# Patient Record
Sex: Female | Born: 1975 | Race: White | Hispanic: No | Marital: Married | Smoking: Never smoker
Health system: Southern US, Community
[De-identification: ages and names within clinical notes are randomized; demographics above are authoritative.]

## PROBLEM LIST (undated history)

## (undated) DIAGNOSIS — J309 Allergic rhinitis, unspecified: Secondary | ICD-10-CM

## (undated) DIAGNOSIS — G44209 Tension-type headache, unspecified, not intractable: Secondary | ICD-10-CM

## (undated) HISTORY — DX: Allergic rhinitis, unspecified: J30.9

## (undated) HISTORY — DX: Tension-type headache, unspecified, not intractable: G44.209

---

## 1994-02-13 HISTORY — PX: BREAST SURGERY: SHX581

## 1997-07-03 ENCOUNTER — Other Ambulatory Visit: Admission: RE | Admit: 1997-07-03 | Discharge: 1997-07-03 | Payer: Self-pay | Admitting: Obstetrics and Gynecology

## 1998-08-24 ENCOUNTER — Other Ambulatory Visit: Admission: RE | Admit: 1998-08-24 | Discharge: 1998-08-24 | Payer: Self-pay | Admitting: Obstetrics and Gynecology

## 1999-08-23 ENCOUNTER — Other Ambulatory Visit: Admission: RE | Admit: 1999-08-23 | Discharge: 1999-08-23 | Payer: Self-pay | Admitting: Obstetrics and Gynecology

## 2002-03-21 ENCOUNTER — Other Ambulatory Visit: Admission: RE | Admit: 2002-03-21 | Discharge: 2002-03-21 | Payer: Self-pay | Admitting: Obstetrics and Gynecology

## 2002-09-05 ENCOUNTER — Encounter: Admission: RE | Admit: 2002-09-05 | Discharge: 2002-09-05 | Payer: Self-pay | Admitting: Obstetrics and Gynecology

## 2002-09-05 ENCOUNTER — Encounter: Payer: Self-pay | Admitting: Obstetrics and Gynecology

## 2003-05-07 ENCOUNTER — Other Ambulatory Visit: Admission: RE | Admit: 2003-05-07 | Discharge: 2003-05-07 | Payer: Self-pay | Admitting: Obstetrics and Gynecology

## 2004-06-08 ENCOUNTER — Other Ambulatory Visit: Admission: RE | Admit: 2004-06-08 | Discharge: 2004-06-08 | Payer: Self-pay | Admitting: Obstetrics and Gynecology

## 2004-08-17 ENCOUNTER — Other Ambulatory Visit: Admission: RE | Admit: 2004-08-17 | Discharge: 2004-08-17 | Payer: Self-pay | Admitting: Obstetrics and Gynecology

## 2005-02-14 ENCOUNTER — Other Ambulatory Visit: Admission: RE | Admit: 2005-02-14 | Discharge: 2005-02-14 | Payer: Self-pay | Admitting: Obstetrics and Gynecology

## 2005-05-11 ENCOUNTER — Encounter: Admission: RE | Admit: 2005-05-11 | Discharge: 2005-05-11 | Payer: Self-pay | Admitting: Family Medicine

## 2007-11-19 ENCOUNTER — Encounter (INDEPENDENT_AMBULATORY_CARE_PROVIDER_SITE_OTHER): Payer: Self-pay | Admitting: Obstetrics and Gynecology

## 2007-11-19 ENCOUNTER — Inpatient Hospital Stay (HOSPITAL_COMMUNITY): Admission: RE | Admit: 2007-11-19 | Discharge: 2007-11-22 | Payer: Self-pay | Admitting: Obstetrics and Gynecology

## 2008-02-14 DIAGNOSIS — G44209 Tension-type headache, unspecified, not intractable: Secondary | ICD-10-CM

## 2008-02-14 HISTORY — DX: Tension-type headache, unspecified, not intractable: G44.209

## 2010-06-28 NOTE — Op Note (Signed)
NAMEJENALYN, Briana Sanchez            ACCOUNT NO.:  [id number]   MEDICAL RECORD NO.:  [id number]          PATIENT TYPE:  INP   LOCATION:  9126                          FACILITY:  [facility]   PHYSICIAN:  Guy Sandifer. Henderson Cloud, M.D. DATE OF BIRTH:  23-Oct-1975   DATE OF PROCEDURE:  11/19/2007  DATE OF DISCHARGE:                               OPERATIVE REPORT   PREOPERATIVE DIAGNOSES:  1. Anterior pregnancy at 39-0/7 weeks.  2. Breech.   POSTOPERATIVE DIAGNOSES:  1. Anterior pregnancy at 39-0/7 weeks.  2. Breech.   PROCEDURE:  Low-transverse cesarean section.   SURGEON:  Guy Sandifer. Henderson Cloud, MD   ANESTHESIA:  Spinal, Quillian Quince, MD   ESTIMATED BLOOD LOSS:  800 mL.   SPECIMENS:  Placenta to pathology.   FINDINGS:  Viable female infant, Apgars of 6, 8 and 9 at 1, 5 and 10  minutes respectively.  Arterial pH 7.34.  Birth weight pending.   INDICATIONS AND CONSENT:  This patient is a 35 year old married white  female G1, P0 with an EDC of November 26, 2007, who is known to be  breech.  Details are dictated in history and physical.  Cesarean section  discussed preoperatively.  Potential risks and complications were  discussed preoperatively including but limited to infection, organ  damage, bleeding requiring transfusion of blood products with HIV and  hepatitis acquisition, DVT, PE, pneumonia.  All questions were answered  and consent was signed on the chart.   PROCEDURE:  The patient was taken to operating room where she was  identified, spinal anesthetic was placed, and she was placed in the  dorsal supine position with 15-degree left lateral wedge.  She was then  prepped.  Foley catheter was placed and bladders were drained and she  was draped in a sterile fashion.  After testing for adequate spinal  anesthesia, skin was entered through a Pfannenstiel incision.  Dissection was carried out in layers to the peritoneum.  Peritoneum was  incised and extended superiorly and inferiorly.   Vesicouterine  peritoneum was taken down cephalolaterally.  Bladder flap was developed  and bladder blade was placed.  Uterus was then incised in a low-  transverse manner and the uterine cavity was entered bluntly with a  hemostat.  Clear fluid was noted.  The uterine was incision was extended  cephalolaterally with fingers.  Baby was then delivered from the frank  breech position without difficulty.  A single nuchal cord was noted and  reduced.  Oro and nasopharynx were suctioned.  Cord was clamped and cut.  Good cry and tone was noted and the baby was handed to waiting  pediatrics team.  Placenta was then manually delivered and sent to  pathology.  Inspection revealed uterine cavity to be clean.  Uterus was  closed in two running locking imbricating layers of 0 Monocryl suture.  A single figure-of-eight of the same suture was placed in the center of  the incision to obtain complete hemostasis.  Anterior peritoneum was  closed in  running fashion with 0 Monocryl suture, which was also used to  reapproximate the foraminal muscle in midline.  Anterior rectus fascia  was closed in running fashion with a 0 loop PDS.  Skin was closed with  clips.  All sponge, instrument, and needle counts were correct and the  patient was transferred to the recovery room in stable condition.      Guy Sandifer Henderson Cloud, M.D.  Electronically Signed     JET/MEDQ  D:  11/19/2007  T:  11/20/2007  Job:  [id number]

## 2010-06-28 NOTE — H&P (Signed)
Briana Sanchez, CARE            ACCOUNT NO.:  [id number]   MEDICAL RECORD NO.:  [id number]          PATIENT TYPE:  INP   LOCATION:                                FACILITY:  [facility]   PHYSICIAN:  Guy Sandifer. Henderson Cloud, M.D. DATE OF BIRTH:  03-06-1975   DATE OF ADMISSION:  11/19/2007  DATE OF DISCHARGE:                              HISTORY & PHYSICAL   CHIEF COMPLAINT:  Breech   HISTORY OF PRESENT ILLNESS:  This patient is a 35 year old married white  female G1, P0 with an EDC of November 26, 2007, placing her at 47 and  0/7th weeks estimated additional age who has a breech baby on  examination and ultrasound.  After discussing options, she is being  admitted for cesarean section.  Potential risks and complications have  been discussed preoperatively.   PAST MEDICAL HISTORY, PAST SURGICAL HISTORY, FAMILY HISTORY, OBSTETRIC  HISTORY:  See prenatal history and physical.   REVIEW OF SYMPTOMS:  NEURO:  Denies headache.  CARDIAC:  Denies chest  pain.  PULMONARY:  Denies shortness of breath.   MEDICATIONS:  Prenatal vitamins.   ALLERGIES:  No known drug allergies.   PHYSICAL EXAMINATION:  VITAL SIGNS:  Height 5 feet 1 inch, weight 143  pounds, blood pressure 100/62.  LUNGS:  Clear to auscultation.  HEART:  Regular rate and rhythm.  ABDOMEN:  Gravid, nontender.  Cervix is closed, thick, high.  EXTREMITIES:  Grossly within normal limits.  NEUROLOGIC:  Grossly within normal limits.   ASSESSMENT:  Intrauterine pregnancy at 39 weeks with breech.   PLAN:  Cesarean section.      Guy Sandifer Henderson Cloud, M.D.  Electronically Signed     JET/MEDQ  D:  11/18/2007  T:  11/19/2007  Job:  [id number]

## 2010-07-01 NOTE — Discharge Summary (Signed)
Briana Sanchez, Briana Sanchez            ACCOUNT NO.:  [id number]   MEDICAL RECORD NO.:  [id number]          PATIENT TYPE:  INP   LOCATION:  9126                          FACILITY:  [facility]   PHYSICIAN:  Juluis Mire, M.D.   DATE OF BIRTH:  1976/02/05   DATE OF ADMISSION:  11/19/2007  DATE OF DISCHARGE:  11/22/2007                               DISCHARGE SUMMARY   ADMITTING DIAGNOSES:  1. Intrauterine pregnancy at 75 weeks estimated gestational age.  2. Breech presentation.   DISCHARGE DIAGNOSES:  1. Status post low transverse cesarean section.  2. Viable female infant.   PROCEDURE:  Primary low transverse cesarean section.   REASON FOR ADMISSION:  Please see dictated H&P.   HOSPITAL COURSE:  The patient is a 35 year old white married female  primigravida that was admitted to [facility] for  scheduled cesarean section.  The patient did have a known breech  presentation.  On the morning of admission, the patient was taken to the  operating room where spinal anesthesia was administered without  difficulty.  A low transverse incision was made, delivery of a viable  female infant weighing 7 pounds 10 ounces, Apgars of 6 at 1 minute, 8 at  5 minute, 9 at 10 minute.  Arterial cord pH was 7.34.  The patient  tolerated the procedure well, was taken to the recovery room in stable  condition.  On postoperative day #1, the patient was without complaint.  Vital signs were stable.  She was afebrile.  Abdomen soft with good  return of bowel function.  Fundus was firm and nontender.  Abdominal  dressing was noted to be clean, dry, and intact.  Foley had been  discontinued; however, the patient has not voided at the time of  rounding.  Laboratory findings revealed hemoglobin of 11.8, platelet  count of 169,000, WBC count of 11.7.  Blood type was noted to be A  positive.  Postoperative day #2, the patient was without complaint other  than some itching at the incisional site.  Vital  signs were stable.  She  was afebrile.  Abdomen soft.  Fundus firm and nontender.  Staples were  intact.  Small erythema was noted from previous adherence.  The patient  was given some hydrocortisone cream topically to apply to the affected  area.  On postoperative day #3, the patient was without complaint.  Vital signs were stable.  She was afebrile.  Fundus firm and nontender.  Incision was clean, dry, and intact.  Staples were removed and the  patient was later discharged home.   CONDITION ON DISCHARGE:  Stable.   DIET:  Regular as tolerated.   ACTIVITY:  No heavy lifting, no driving x2 weeks, no vaginal entry.   FOLLOW UP:  The patient to follow up in the office in 1-2 weeks for  incision check.  She is to call for temperature greater than 100  degrees, persistent nausea, vomiting, heavy vaginal bleeding and/or  redness or drainage from the incisional site.   DISCHARGE MEDICATIONS:  1. Tylox #30 one p.o. every 4-6 hours p.r.n.  2. Motrin 600 mg every  6 hours.  3. Prenatal vitamins one p.o. daily.  4. Colace one p.o. daily.      Julio Sicks, N.P.      Juluis Mire, M.D.  Electronically Signed    CC/MEDQ  D:  12/23/2007  T:  12/23/2007  Job:  [id number]

## 2010-09-30 ENCOUNTER — Ambulatory Visit: Payer: Self-pay | Admitting: Internal Medicine

## 2010-11-02 ENCOUNTER — Ambulatory Visit (INDEPENDENT_AMBULATORY_CARE_PROVIDER_SITE_OTHER): Payer: 59 | Admitting: Internal Medicine

## 2010-11-02 ENCOUNTER — Encounter: Payer: Self-pay | Admitting: Internal Medicine

## 2010-11-02 VITALS — BP 104/64 | HR 60 | Temp 97.6°F | Resp 16 | Ht 61.0 in | Wt 115.0 lb

## 2010-11-02 DIAGNOSIS — Z0000 Encounter for general adult medical examination without abnormal findings: Secondary | ICD-10-CM

## 2010-11-02 NOTE — Progress Notes (Signed)
  Subjective:    Patient ID: Briana Sanchez, female    DOB: Jul 07, 1975, 35 y.o.   MRN: [id number]  HPI New to me for a brief physical, she saw Dr. Huntley Dec 9 months ago and she tells me that her labs (FLP,TSH,CBC) were normal but no records are available for me today.   Review of Systems  Constitutional: Negative.   HENT: Negative.   Eyes: Negative.   Respiratory: Negative.   Cardiovascular: Negative.   Gastrointestinal: Negative.   Genitourinary: Negative.   Musculoskeletal: Negative.   Skin: Negative.   Neurological: Negative.   Hematological: Negative.   Psychiatric/Behavioral: Negative.        Objective:   Physical Exam  Vitals reviewed. Constitutional: She is oriented to person, place, and time. She appears well-developed and well-nourished. No distress.  HENT:  Mouth/Throat: Oropharynx is clear and moist. No oropharyngeal exudate.  Eyes: Conjunctivae are normal. Right eye exhibits no discharge. Left eye exhibits no discharge. No scleral icterus.  Neck: Normal range of motion. Neck supple. No JVD present. No tracheal deviation present. No thyromegaly present.  Cardiovascular: Normal rate, regular rhythm, normal heart sounds and intact distal pulses.  Exam reveals no gallop and no friction rub.   No murmur heard. Pulmonary/Chest: Effort normal and breath sounds normal. No stridor. No respiratory distress. She has no wheezes. She has no rales. She exhibits no tenderness.  Abdominal: Soft. Bowel sounds are normal. She exhibits no distension and no mass. There is no tenderness. There is no rebound and no guarding.  Musculoskeletal: Normal range of motion. She exhibits no edema and no tenderness.  Lymphadenopathy:    She has no cervical adenopathy.  Neurological: She is alert and oriented to person, place, and time. She has normal reflexes.  Skin: Skin is warm and dry. No rash noted. She is not diaphoretic. No erythema. No pallor.  Psychiatric: She has a normal mood and  affect. Her behavior is normal. Judgment and thought content normal.          Assessment & Plan:

## 2010-11-02 NOTE — Patient Instructions (Signed)

## 2010-11-15 LAB — CBC
HCT: 40.5
MCHC: 33.5
MCV: 93.2
MCV: 93.5
Platelets: 169
Platelets: 194
RBC: 3.65 — ABNORMAL LOW
RDW: 12.9
WBC: 11.7 — ABNORMAL HIGH
WBC: 12.4 — ABNORMAL HIGH

## 2011-03-22 ENCOUNTER — Ambulatory Visit (INDEPENDENT_AMBULATORY_CARE_PROVIDER_SITE_OTHER)
Admission: RE | Admit: 2011-03-22 | Discharge: 2011-03-22 | Disposition: A | Discharge status: Home / Self Care | Payer: 59 | Source: Ambulatory Visit | Attending: Internal Medicine | Admitting: Internal Medicine

## 2011-03-22 ENCOUNTER — Encounter: Payer: Self-pay | Admitting: Internal Medicine

## 2011-03-22 ENCOUNTER — Ambulatory Visit (INDEPENDENT_AMBULATORY_CARE_PROVIDER_SITE_OTHER): Payer: 59 | Admitting: Internal Medicine

## 2011-03-22 VITALS — BP 98/68 | HR 74 | Temp 98.0°F | Resp 16 | Ht 61.0 in | Wt 116.0 lb

## 2011-03-22 DIAGNOSIS — J209 Acute bronchitis, unspecified: Secondary | ICD-10-CM | POA: Insufficient documentation

## 2011-03-22 DIAGNOSIS — J189 Pneumonia, unspecified organism: Secondary | ICD-10-CM | POA: Insufficient documentation

## 2011-03-22 DIAGNOSIS — R05 Cough: Secondary | ICD-10-CM

## 2011-03-22 MED ORDER — CEFUROXIME AXETIL 500 MG PO TABS: 500.0000 mg | ORAL_TABLET | Freq: Two times a day (BID) | ORAL | Status: AC

## 2011-03-22 MED ORDER — PSEUDOEPHEDRINE-CODEINE-GG 30-10-100 MG/5ML PO SOLN: 10.0000 mL | Freq: Four times a day (QID) | ORAL | Status: AC | PRN

## 2011-03-22 MED ORDER — AZITHROMYCIN 500 MG PO TABS: 500.0000 mg | ORAL_TABLET | Freq: Every day | ORAL | Status: AC

## 2011-03-22 NOTE — Patient Instructions (Signed)

## 2011-03-23 ENCOUNTER — Telehealth: Payer: Self-pay

## 2011-03-23 NOTE — Assessment & Plan Note (Signed)
CXR is + for PNA 

## 2011-03-23 NOTE — Assessment & Plan Note (Signed)
I have asked her to take double antibiotic coverage with zpak and ceftin

## 2011-03-23 NOTE — Progress Notes (Signed)
  Subjective:    Patient ID: Briana Sanchez, female    DOB: 08/29/1975, 36 y.o.   MRN: [id number]  URI  This is a new problem. The current episode started in the past 7 days. The problem has been gradually worsening. The maximum temperature recorded prior to her arrival was 102 - 102.9 F. The fever has been present for 1 to 2 days. Associated symptoms include congestion, coughing and a sore throat. Pertinent negatives include no abdominal pain, chest pain, diarrhea, dysuria, ear pain, headaches, joint pain, joint swelling, nausea, neck pain, plugged ear sensation, rash, rhinorrhea, sinus pain, sneezing, swollen glands, vomiting or wheezing. She has tried nothing for the symptoms.      Review of Systems  Constitutional: Positive for fever, chills and fatigue. Negative for diaphoresis, activity change, appetite change and unexpected weight change.  HENT: Positive for congestion and sore throat. Negative for ear pain, facial swelling, rhinorrhea, sneezing, trouble swallowing, neck pain, neck stiffness, voice change, postnasal drip, sinus pressure and ear discharge.   Eyes: Negative.   Respiratory: Positive for cough. Negative for apnea, choking, chest tightness, shortness of breath, wheezing and stridor.   Cardiovascular: Negative for chest pain, palpitations and leg swelling.  Gastrointestinal: Negative for nausea, vomiting, abdominal pain, diarrhea, constipation, blood in stool, abdominal distention, anal bleeding and rectal pain.  Genitourinary: Negative for dysuria, urgency, frequency, hematuria, flank pain, decreased urine volume, vaginal bleeding, vaginal discharge, enuresis, difficulty urinating, genital sores, vaginal pain, menstrual problem, pelvic pain and dyspareunia.  Musculoskeletal: Negative for myalgias, back pain, joint pain, joint swelling, arthralgias and gait problem.  Skin: Negative for color change, pallor, rash and wound.  Neurological: Negative for dizziness, tremors,  seizures, syncope, facial asymmetry, speech difficulty, weakness, light-headedness, numbness and headaches.  Hematological: Negative for adenopathy. Does not bruise/bleed easily.  Psychiatric/Behavioral: Negative.        Objective:   Physical Exam  Vitals reviewed. Constitutional: She is oriented to person, place, and time. She appears well-developed and well-nourished. No distress.  HENT:  Head: Normocephalic and atraumatic.  Mouth/Throat: Oropharynx is clear and moist. No oropharyngeal exudate.  Eyes: Conjunctivae are normal. Right eye exhibits no discharge. Left eye exhibits no discharge. No scleral icterus.  Neck: Normal range of motion. Neck supple. No JVD present. No tracheal deviation present. No thyromegaly present.  Cardiovascular: Normal rate, regular rhythm, normal heart sounds and intact distal pulses.  Exam reveals no gallop and no friction rub.   No murmur heard. Pulmonary/Chest: Effort normal and breath sounds normal. No stridor. No respiratory distress. She has no wheezes. She has no rales. She exhibits no tenderness.  Abdominal: Soft. Bowel sounds are normal. She exhibits no distension and no mass. There is no tenderness. There is no rebound and no guarding.  Musculoskeletal: Normal range of motion. She exhibits no edema and no tenderness.  Lymphadenopathy:    She has no cervical adenopathy.  Neurological: She is oriented to person, place, and time.  Skin: Skin is warm and dry. No rash noted. She is not diaphoretic. No erythema. No pallor.  Psychiatric: She has a normal mood and affect. Her behavior is normal. Judgment and thought content normal.      Lab Results  Component Value Date   WBC 11.7* 11/20/2007   HGB 11.8* 11/20/2007   HCT 34.1* 11/20/2007   PLT 169 11/20/2007      Assessment & Plan:

## 2011-03-23 NOTE — Assessment & Plan Note (Signed)
Start antibiotics and symptom reliever

## 2011-03-23 NOTE — Telephone Encounter (Signed)
Patient called [facility] requesting to know if she is considered contagious due to recent dx of pneumonia. Check with MD who advised that pt contagious until she has had abx in  System for at least 48hrs. Patient notified

## 2011-03-29 ENCOUNTER — Telehealth: Payer: Self-pay

## 2011-03-29 DIAGNOSIS — J189 Pneumonia, unspecified organism: Secondary | ICD-10-CM

## 2011-03-29 NOTE — Telephone Encounter (Signed)
sure

## 2011-03-29 NOTE — Telephone Encounter (Signed)
Called patient, no answer//order placed in Epic

## 2011-03-29 NOTE — Telephone Encounter (Signed)
Patient called stating that she was seen two weeks ago and dx with pneumona. She recevied cxr result letter which advised her to repeat in two weeks. Please advise if ok to order Thanks

## 2011-03-30 NOTE — Telephone Encounter (Signed)
Called patient/[facility] advising of xray order

## 2011-04-06 ENCOUNTER — Ambulatory Visit (INDEPENDENT_AMBULATORY_CARE_PROVIDER_SITE_OTHER)
Admission: RE | Admit: 2011-04-06 | Discharge: 2011-04-06 | Disposition: A | Discharge status: Home / Self Care | Payer: 59 | Source: Ambulatory Visit | Attending: Internal Medicine | Admitting: Internal Medicine

## 2011-04-06 DIAGNOSIS — J189 Pneumonia, unspecified organism: Secondary | ICD-10-CM

## 2011-06-26 ENCOUNTER — Encounter: Payer: Self-pay | Admitting: Internal Medicine

## 2011-06-26 ENCOUNTER — Ambulatory Visit (INDEPENDENT_AMBULATORY_CARE_PROVIDER_SITE_OTHER): Payer: 59 | Admitting: Internal Medicine

## 2011-06-26 VITALS — BP 90/68 | HR 70 | Temp 98.1°F | Ht 62.0 in | Wt 117.1 lb

## 2011-06-26 DIAGNOSIS — J309 Allergic rhinitis, unspecified: Secondary | ICD-10-CM

## 2011-06-26 DIAGNOSIS — H1010 Acute atopic conjunctivitis, unspecified eye: Secondary | ICD-10-CM

## 2011-06-26 DIAGNOSIS — H1045 Other chronic allergic conjunctivitis: Secondary | ICD-10-CM

## 2011-06-26 MED ORDER — AZELASTINE-FLUTICASONE 137-50 MCG/ACT NA SUSP: 1.0000 | Freq: Two times a day (BID) | NASAL | Status: DC

## 2011-06-26 MED ORDER — PREDNISONE (PAK) 10 MG PO TABS: 10.0000 mg | ORAL_TABLET | ORAL | Status: AC

## 2011-06-26 MED ORDER — FEXOFENADINE HCL 180 MG PO TABS: 180.0000 mg | ORAL_TABLET | Freq: Every day | ORAL | Status: AC

## 2011-06-26 NOTE — Progress Notes (Signed)
complains of severe allergic rhinitis symptoms: Clear drainage fro nose and eyes, sneezing Using eye drops from optho but unimproved OTC meds ineffective  Past Medical History  Diagnosis Date  . Tension headache 2010     ROS: Gen: no fever HENT: no sinus pressure Resp: No cough   PE:  BP 90/68  Pulse 70  Temp(Src) 98.1 F (36.7 C) (Oral)  Ht 5\' 2"  (1.575 m)  Wt 117 lb 1.9 oz (53.125 kg)  BMI 21.42 kg/m2  SpO2 96% Gen: soft tissue edema/swelling nasal region and inner eyes - nontoxic HENT: clear rhinorrhea, no sinus tenderness to palp, OP clear Eyes: mod conjunctivitis B, PERRL, vision intact Resp: CTA B CV: RRR   A/P: Allergic sinusitis, severe symptoms Allergic conjunctivitis  No evidence for infection Continue Allegra Add nasal antihistamine and steroid combo pred pak for severe symptoms  Continue optho tx as ongoing Call if worse or unimproved - pt understands and agrees

## 2011-06-26 NOTE — Patient Instructions (Signed)
It was good to see you today. If you develop worsening symptoms or fever, call and we can reconsider antibiotics, but it does not appear necessary to use antibiotics at this time. Start Dymista - 1 spray each nostril 2x/day Also pred taper for severe allergies over next 6 days Your prescription(s) have been submitted to your pharmacy. Please take as directed and contact our office if you believe you are having problem(s) with the medication(s). Continue Allegra or Zyrtec daily

## 2011-07-28 ENCOUNTER — Encounter: Payer: Self-pay | Admitting: Internal Medicine

## 2011-07-28 ENCOUNTER — Ambulatory Visit (INDEPENDENT_AMBULATORY_CARE_PROVIDER_SITE_OTHER): Payer: 59 | Admitting: Internal Medicine

## 2011-07-28 VITALS — BP 120/68 | HR 73 | Temp 97.6°F | Resp 16 | Wt 120.0 lb

## 2011-07-28 DIAGNOSIS — H04559 Acquired stenosis of unspecified nasolacrimal duct: Secondary | ICD-10-CM

## 2011-07-28 DIAGNOSIS — IMO0002 Reserved for concepts with insufficient information to code with codable children: Secondary | ICD-10-CM | POA: Insufficient documentation

## 2011-07-28 NOTE — Patient Instructions (Signed)
Dacryocystitis  Dacryocystitis is an infection of the tear sac. The tear sac lies between the inner corner of the eyelids and the nose. The glands of the eyelids always produce tears. This is to keep the surface of the eye wet and protect it. These tears drain from the surface of the eyes through a duct in each lid (lacrimal ducts), then through the tear sac (lacrimal sac) into the nose. The tears are then swallowed. If the tear ducts become blocked, bacteria begin to buildup. The tear sac can become infected. Dacryocystitis may be sudden (acute) or long-lasting (chronic). This problem is most common in infants because the tear ducts are not fully developed and clog easily. In that case, babies may have episodes of tearing and infection. However, in most cases, the problem gets better as the child grows. CAUSES   Malformation of the tear duct.   Injury.   Eye infection.   Trauma.   Injury or inflammation of the nasal passages.  The cause is often unknown. SYMPTOMS   Usually only 1 eye is involved.   Tearing and discharge from the involved eye.   Tenderness, redness and swelling of the lower lid near the nose.   A sore, red, inflamed bump on the inner corner of the lower lid.  DIAGNOSIS  A diagnosis is made by:  An exam of the eye to find out how much blockage is present and if the surface of the eye is also infected.   Getting cultures to see if a specific infection is present.  TREATMENT  Treatment depends on:   The person's age.   Whether or not the infection is chronic or acute.   Amount of blockage is present.  Additional treatment  In infants, sometimes massaging the area (starting from the inside of the eye and gently massaging down toward the nose) will improve the condition, combined with antibiotic eye drops or ointments.   If the above does not work, it may be necessary to probe the ducts and open up the drainage system. While this is easily done in the office in  adults, probing usually has to be done under general anesthesia in infants.   If the blockage cannot be cleared by probing, surgery may be needed under general anesthesia to create a direct opening for tears to flow between the tear sac and the inside of the nose (dacryocystorhinostomy or DCR).  HOME CARE INSTRUCTIONS   Antibiotic drops or ointment may be prescribed after probing procedures.   If surgery has been performed, oral antibiotics may also be prescribed.   Finish all medications as prescribed, even if your symptoms get better.  SEEK MEDICAL CARE IF:  You develop tearing of 1 or both eyes and a sore, red, swollen lump between your lower eyelid and nose. This is especially true if you have these symptoms and a fever. SEEK IMMEDIATE MEDICAL CARE IF:   There is increased pain, swelling, redness or drainage from the eye.   There are signs of generalized infection including muscle aches, chills, fever or a general ill feeling.   You or your child has an oral temperature above 102 F (38.9 C), not controlled by medicine.   Your baby is older than 3 months with a rectal temperature of 102 F (38.9 C) or higher.   Your baby is 3 months old or younger with a rectal temperature of 100.4 F (38 C) or higher.  MAKE SURE YOU:   Understand these instructions.   Will   watch your condition.   Will get help right away if you are not doing well or get worse.  Document Released: 01/28/2000 Document Revised: 01/19/2011 Document Reviewed: 03/21/2007 [employer] Patient Information 2012 [employer]. 

## 2011-07-29 ENCOUNTER — Encounter: Payer: Self-pay | Admitting: Internal Medicine

## 2011-07-29 NOTE — Progress Notes (Signed)
  Subjective:    Patient ID: Briana Sanchez, female    DOB: Aug 27, 1975, 36 y.o.   MRN: [id number]  Eye Problem  There is pain in the right eye. This is a recurrent problem. The current episode started more than 1 month ago. The problem occurs constantly. The problem has been gradually worsening. There was no injury mechanism. The pain is at a severity of 0/10. The patient is experiencing no pain. There is no known exposure to pink eye. She does not wear contacts. Associated symptoms include an eye discharge (constant clear drainage (watering) from her right eye,). Pertinent negatives include no blurred vision, double vision, eye redness, fever, foreign body sensation, itching, nausea, photophobia, recent URI or vomiting. She has tried eye drops for the symptoms. The treatment provided no relief.      Review of Systems  Constitutional: Negative.  Negative for fever.  HENT: Negative.   Eyes: Positive for discharge (constant clear drainage (watering) from her right eye,). Negative for blurred vision, double vision, photophobia, pain, redness, itching and visual disturbance.  Respiratory: Negative.   Cardiovascular: Negative.   Gastrointestinal: Negative.  Negative for nausea and vomiting.  Genitourinary: Negative.   Musculoskeletal: Negative.   Skin: Negative.  Negative for itching.  Neurological: Negative.   Hematological: Negative.   Psychiatric/Behavioral: Negative.        Objective:   Physical Exam  Vitals reviewed. Constitutional: She appears well-developed and well-nourished. No distress.  Eyes: Conjunctivae and EOM are normal. Right eye exhibits no chemosis, no discharge, no exudate and no hordeolum. No foreign body present in the right eye. Left eye exhibits no chemosis and no hordeolum. No foreign body present in the left eye. Right conjunctiva is not injected. Right conjunctiva has no hemorrhage. Left conjunctiva is not injected. Left conjunctiva has no hemorrhage. No scleral  icterus. Right eye exhibits normal extraocular motion and no nystagmus. Left eye exhibits normal extraocular motion and no nystagmus.    Neck: Normal range of motion. Neck supple. No JVD present. No tracheal deviation present. No thyromegaly present.  Cardiovascular: Normal rate, regular rhythm, normal heart sounds and intact distal pulses.  Exam reveals no gallop and no friction rub.   No murmur heard. Pulmonary/Chest: Effort normal and breath sounds normal. No stridor. No respiratory distress. She has no wheezes. She has no rales. She exhibits no tenderness.  Lymphadenopathy:    She has no cervical adenopathy.  Skin: Skin is warm and dry. No rash noted. She is not diaphoretic. No erythema. No pallor.          Assessment & Plan:

## 2011-07-29 NOTE — Assessment & Plan Note (Signed)
I see no s/s of infection or allergy today, I have asked her to see ophthalmology to see about the tear duct

## 2011-12-29 ENCOUNTER — Ambulatory Visit (INDEPENDENT_AMBULATORY_CARE_PROVIDER_SITE_OTHER): Payer: 59 | Admitting: Internal Medicine

## 2011-12-29 ENCOUNTER — Encounter: Payer: Self-pay | Admitting: Internal Medicine

## 2011-12-29 ENCOUNTER — Other Ambulatory Visit: Payer: 59

## 2011-12-29 VITALS — BP 108/78 | HR 70 | Ht 61.5 in | Wt 120.8 lb

## 2011-12-29 DIAGNOSIS — J309 Allergic rhinitis, unspecified: Secondary | ICD-10-CM

## 2011-12-29 DIAGNOSIS — H1010 Acute atopic conjunctivitis, unspecified eye: Secondary | ICD-10-CM

## 2011-12-29 DIAGNOSIS — H1045 Other chronic allergic conjunctivitis: Secondary | ICD-10-CM

## 2011-12-29 MED ORDER — AZELASTINE-FLUTICASONE 137-50 MCG/ACT NA SUSP: 2.0000 | Freq: Every day | NASAL | Status: DC

## 2011-12-29 NOTE — Patient Instructions (Addendum)
Sample Dymista nasal spray      1-2 puffs each nostril once daily at bedtime   Effect tends to be cumulative, building up over several days of use  Order- Allergy profile     Dx allergic rhinitis, allergic conjunctivitis  Ok to continue Allegra and Dr Randon Goldsmith' eye drops  Please call as needed

## 2011-12-29 NOTE — Progress Notes (Signed)
12/29/11 36 yoF never smoker seeking allergy evaluation at the suggestion of her ophthalmologist Dr. Antony Contras She was diagnosed with allergic rhinitis in childhood and has had seasonal problems since then. Usually this is felt in spring and fall as burning and itching of eyes, sneezing. She has not had asthma or significant problems with food or skin rashes. Skin testing as a child without allergy vaccine. No ENT surgery. She is being treated for obstructed lacrimal ducts and allergic conjunctivitis using eyedrops, rarely including a steroid eye drop. Primary recognized trigger has been seasonal pollens. Last prednisone April 2013. Married living with husband and 46-year-old daughter, working in an office environment. House has basement, no recognized mold, no pets or smokers. She does not take flu shot. Told that she snores when nose is congested. Some associated sore throat and headaches. CXR 04/06/11-reviewed IMPRESSION:  No active disease. Improvement in aeration. No new infiltrate.  Original Report Authenticated By: Natasha Mead, M.D.    Prior to Admission medications   Medication Sig Start Date End Date Taking? Authorizing Provider  Azelastine-Fluticasone 137-50 MCG/ACT SUSP Place 1 spray into the nose 2 (two) times daily. 06/26/11  Yes Newt Lukes, MD  fexofenadine (ALLEGRA) 180 MG tablet Take 1 tablet (180 mg total) by mouth daily. 06/26/11 06/25/12 Yes Newt Lukes, MD  Multiple Vitamin (MULTIVITAMIN) tablet Take 1 tablet by mouth daily.   Yes Historical Provider, MD  Norgestimate-Eth Estradiol (ORTHO-CYCLEN, 28, PO) Take by mouth.     Yes Historical Provider, MD  olopatadine (PATANOL) 0.1 % ophthalmic solution Place 1 drop into both eyes 2 (two) times daily. 06/26/11  Yes Newt Lukes, MD  Azelastine-Fluticasone (DYMISTA) 137-50 MCG/ACT SUSP Place 2 sprays into both nostrils at bedtime. 12/29/11   Waymon Budge, MD   Past Medical History  Diagnosis Date  . Tension  headache 2010  . Allergic rhinitis    Past Surgical History  Procedure Date  . Cesarean section 11/19/2007  . Breast surgery 1996    augmentation   Family History  Problem Relation Age of Onset  . Alcohol abuse Other   . Drug abuse Other   . Arthritis Other   . Lung cancer Paternal Grandfather   . Hypertension Other   . Emphysema Maternal Grandmother   . Rheum arthritis Maternal Grandmother   . Rheum arthritis Mother   . Cervical cancer Maternal Grandmother    History   Social History  . Marital Status: Married    Spouse Name: N/A    Number of Children: 1  . Years of Education: N/A   Occupational History  . brand Production designer, theatre/television/film   .  [employer]   Social History Main Topics  . Smoking status: Never Smoker   . Smokeless tobacco: Never Used  . Alcohol Use: 1.2 oz/week    2 Glasses of wine per week  . Drug Use: No  . Sexually Active: Yes    Birth Control/ Protection: Pill   Other Topics Concern  . Not on file   Social History Narrative   Caffienated drinks-RareSeat belt use often-yesRegular Exercise-yesSmoke alarm in the home-yesFirearms/guns in the home-noHistory of physical abuse-no   ROS-see HPI Constitutional:   No-   weight loss, night sweats, fevers, chills, fatigue, lassitude. HEENT:   No-  headaches, difficulty swallowing, tooth/dental problems, sore throat,       +  sneezing, itching, ear ache, nasal congestion, post nasal drip,  CV:  No-   chest pain, orthopnea, PND, swelling in lower  extremities, anasarca, dizziness, palpitations Resp: No-   shortness of breath with exertion or at rest.              No-   productive cough,  No non-productive cough,  No- coughing up of blood.              No-   change in color of mucus.  No- wheezing.   Skin: No-   rash or lesions. GI:  No-   heartburn, indigestion, abdominal pain, nausea, vomiting,  GU:  MS:  No-   joint pain or swelling.   Neuro-     nothing unusual Psych:  No- change in mood or affect. No  depression or anxiety.  No memory loss.   OBJ- Physical Exam General- Alert, Oriented, Affect-appropriate, Distress- none acute Skin- rash-none, lesions- none, excoriation- none Lymphadenopathy- none Head- atraumatic            Eyes- Gross vision intact, PERRLA, minimal conjunctival injection            Ears- Hearing, canals-normal            Nose- + watery sniffing, no-Septal dev, mucus, polyps, erosion, perforation             Throat- Mallampati III , mucosa+ red posterior pharynx without cobblestone , drainage- none, tonsils- atrophic Neck- flexible , trachea midline, no stridor , thyroid nl, carotid no bruit Chest - symmetrical excursion , unlabored           Heart/CV- RRR , no murmur , no gallop  , no rub, nl s1 s2                           - JVD- none , edema- none, stasis changes- none, varices- none           Lung- clear to P&A, wheeze- none, cough- none , dullness-none, rub- none           Chest wall-  Abd- tender-no, distended-no, bowel sounds-present, HSM- no Br/ Gen/ Rectal- Not done, not indicated Extrem- cyanosis- none, clubbing, none, atrophy- none, strength- nl Neuro- grossly intact to observation

## 2012-01-01 LAB — ALLERGY FULL PROFILE
Allergen, D pternoyssinus,d7: 1.46 kU/L — ABNORMAL HIGH
Allergen,Goose feathers, e70: <0.1 kU/L
Alternaria Alternata: <0.1 kU/L
Aspergillus fumigatus, IgG: <0.1 kU/L
Bahia Grass: 4.09 kU/L — ABNORMAL HIGH
Box Elder IgE: <0.1 kU/L
Cat Dander: <0.1 kU/L
Common Ragweed: <0.1 kU/L
Dog Dander: 0.19 kU/L — ABNORMAL HIGH
G005 Rye, Perennial: 22.1 kU/L — ABNORMAL HIGH
Goldenrod: <0.1 kU/L
Helminthosporium halodes: <0.1 kU/L
House Dust Hollister: 0.82 kU/L — ABNORMAL HIGH
IgE (Immunoglobulin E), Serum: 250.8 IU/mL — ABNORMAL HIGH (ref 0.0–180.0)
Lamb's Quarters: 0.16 kU/L — ABNORMAL HIGH
Plantain: 0.15 kU/L — ABNORMAL HIGH
Stemphylium Botryosum: <0.1 kU/L
Sycamore Tree: 0.48 kU/L — ABNORMAL HIGH

## 2012-01-04 NOTE — Progress Notes (Signed)
Quick Note:  Pt aware of results and future appt. ______

## 2012-01-07 DIAGNOSIS — J309 Allergic rhinitis, unspecified: Secondary | ICD-10-CM | POA: Insufficient documentation

## 2012-01-07 NOTE — Assessment & Plan Note (Signed)
Plan-environmental precautions reviewed. Lab-Allergy profile. Sample Dymista nasal spray (antihistamine plus steroid)

## 2012-01-30 ENCOUNTER — Telehealth: Payer: Self-pay | Admitting: Internal Medicine

## 2012-01-30 DIAGNOSIS — H1010 Acute atopic conjunctivitis, unspecified eye: Secondary | ICD-10-CM

## 2012-01-30 MED ORDER — METHYLPREDNISOLONE 4 MG PO KIT: PACK | ORAL | Status: DC

## 2012-01-30 NOTE — Telephone Encounter (Signed)
Patient returning call.

## 2012-01-30 NOTE — Telephone Encounter (Signed)
Pt returned call. Briana Sanchez  

## 2012-01-30 NOTE — Telephone Encounter (Signed)
Patient Information:  Caller Name: Bryonna  Phone: [phone]  Patient: Briana Sanchez, Briana Sanchez  Gender: Female  DOB: Nov 10, 1975  Age: 36 Years  PCP: Sanda Linger (Adults only)  Pregnant: No  Office Follow Up:  Does the office need to follow up with this patient?: Yes  Instructions For The Office: Please call, patient wants to know if Prednisone can be called in for her.  RN Note:  Triaged per allergy symptoms in [facility] which disposition was see provider within 24hrs. Patient is requesting medication. States before when this has happened she was given Prednisone to help with symptoms. Pharmacy is [facility] [address]  Symptoms  Reason For Call & Symptoms: Increase in allergy symptoms. Itchy , red, watery eyes., sneezing and itchy nose  Reviewed Health History In EMR: Yes  Reviewed Medications In EMR: Yes  Reviewed Allergies In EMR: Yes  Reviewed Surgeries / Procedures: Yes  Date of Onset of Symptoms: 01/28/2012  Treatments Tried: Allegra, prescribed nasal spray and eye drops. She doubled allegra dose to 2 pills daily, states physician told her this was alright to do when she needed to.  Treatments Tried Worked: No OB:  LMP: 01/24/2012  Guideline(s) Used:  Eye - Red Without Pus  Disposition Per Guideline:   Home Care  Reason For Disposition Reached:   Red eye caused by sunscreen, smoke, smog, chlorine, food, soap or other mild irritant  Advice Given:  Call Back If:  You become worse.  Patient Refused Recommendation:  Patient Requests Prescription  Patient wants to know if Prednisone can be called in for her.

## 2012-01-30 NOTE — Telephone Encounter (Signed)
LMTC x 1  

## 2012-01-30 NOTE — Telephone Encounter (Signed)
Left message for pt to callback office.  

## 2012-01-30 NOTE — Telephone Encounter (Signed)
lmomtcb x1 for pt 

## 2012-01-30 NOTE — Telephone Encounter (Signed)
done

## 2012-01-30 NOTE — Telephone Encounter (Signed)
Pt c/o red, itchy eyes, sneezing, runny nose for 2-3 days..  Pt is taking Allegra 180 mg daily, Dymista at bedtime and Patanol prn.  Please advise on what else pt can do.    Last ov:12-29-11    Next ov:02-16-12 No Known Allergies

## 2012-01-30 NOTE — Telephone Encounter (Signed)
Per CY-give prednisone 10 mg #6 take 2 today then 1 daily no refills.

## 2012-01-30 NOTE — Telephone Encounter (Signed)
Called and spoke with pt and notified of recs per CDY She states that she had also called Dr Yetta Barre today, and he called her pred rx in and she ha already picked this up I have advised that in this case, we will not be sending in rx, and she should call us if no better Pt verbalized understanding and states nothing further needed

## 2012-01-31 NOTE — Telephone Encounter (Signed)
Left message for pt to callback office.  

## 2012-02-01 NOTE — Telephone Encounter (Signed)
Left message for pt to callback office. Closing phone note after several attempts to contact pt.

## 2012-02-16 ENCOUNTER — Ambulatory Visit (INDEPENDENT_AMBULATORY_CARE_PROVIDER_SITE_OTHER): Payer: 59 | Admitting: Internal Medicine

## 2012-02-16 ENCOUNTER — Encounter: Payer: Self-pay | Admitting: Internal Medicine

## 2012-02-16 VITALS — BP 104/70 | HR 73 | Ht 61.5 in | Wt 125.0 lb

## 2012-02-16 DIAGNOSIS — J309 Allergic rhinitis, unspecified: Secondary | ICD-10-CM

## 2012-02-16 DIAGNOSIS — H1010 Acute atopic conjunctivitis, unspecified eye: Secondary | ICD-10-CM

## 2012-02-16 MED ORDER — AZELASTINE HCL 0.1 % NA SOLN: NASAL | Status: DC

## 2012-02-16 MED ORDER — MONTELUKAST SODIUM 10 MG PO TABS: 10.0000 mg | ORAL_TABLET | Freq: Every day | ORAL | Status: DC

## 2012-02-16 NOTE — Patient Instructions (Addendum)
Script to try nasal antihistamine spray Astelin 1-2 puffs each nostril once or twice daily  Script for Singulair anti-inflammatory     1 daily  Continue allegra- once or twice daily  Add otc Pepcid/ famotadine  Acid blocker (H2 antihistamine)  1 daily  Schedule return for allergy skin testing. Antihistamines block the skin tests, so you will need to be ff all these antihistamines for 3 days before skin testing

## 2012-02-16 NOTE — Progress Notes (Signed)
12/29/11 36 yoF never smoker seeking allergy evaluation at the suggestion of Briana Sanchez ophthalmologist Dr. Antony Contras She was diagnosed with allergic rhinitis in childhood and has had seasonal problems since then. Usually this is felt in spring and fall as burning and itching of eyes, sneezing. She has not had asthma or significant problems with food or skin rashes. Skin testing as a child without allergy vaccine. No ENT surgery. She is being treated for obstructed lacrimal ducts and allergic conjunctivitis using eyedrops, rarely including a steroid eye drop. Primary recognized trigger has been seasonal pollens. Last prednisone April 2013. 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 House has basement, no recognized mold, no pets or smokers. She does not take flu shot. Told that she snores when nose is congested. Some associated sore throat and headaches. CXR 04/06/11-reviewed IMPRESSION:  No active disease. Improvement in aeration. No new infiltrate.  Original Report Authenticated By: Natasha Mead, M.D.   02/16/12- 71 yoF never smoker seeking allergy evaluation at the suggestion of Briana Sanchez ophthalmologist Dr. Antony Contras FOLLOWS FOR: eyes are still itchy and watery; has a flare up near Christmas and was given Pred taper which helped. Review in detail allergy labs with patient. Still using Dr Karma Lew eyedrops. Little wheezing. Has continued to Allegra. Ran out of Dymista nasal spray sample which didn't seem to make a lot of difference. Continues Patanol. Allergy Profile 12/29/2011-total IgE 250.8 with specific elevations for dust mite/house, dust dog dander, and a few incidentals.  ROS-see HPI Constitutional:   No-   weight loss, night sweats, fevers, chills, fatigue, lassitude. HEENT:   No-  headaches, difficulty swallowing, tooth/dental problems, sore throat,       +  sneezing, itching, ear ache, nasal congestion, post nasal drip,  CV:  No-   chest pain, orthopnea,  PND, swelling in lower extremities, anasarca, dizziness, palpitations Resp: No-   shortness of breath with exertion or at rest.              No-   productive cough,  No non-productive cough,  No- coughing up of blood.              No-   change in color of mucus.  No- wheezing.   Skin: No-   rash or lesions. GI:  No-   heartburn, indigestion, abdominal pain, nausea, vomiting,  GU:  MS:  No-   joint pain or swelling.   Neuro-     nothing unusual Psych:  No- change in mood or affect. No depression or anxiety.  No memory loss.   OBJ- Physical Exam General- Alert, Oriented, Affect-appropriate, Distress- none acute Skin- rash-none, lesions- none, excoriation- none Lymphadenopathy- none Head- atraumatic            Eyes- Gross vision intact, PERRLA, minimal conjunctival injection, watery            Ears- Hearing, canals-normal            Nose- + red nose, + watery sniffing, no-Septal dev, mucus, polyps, erosion, perforation             Throat- Mallampati III , mucosa+ red posterior pharynx without cobblestone , drainage- none, tonsils- atrophic Neck- flexible , trachea midline, no stridor , thyroid nl, carotid no bruit Chest - symmetrical excursion , unlabored           Heart/CV- RRR , no murmur , no gallop  , no rub, nl s1 s2                           -  JVD- none , edema- none, stasis changes- none, varices- none           Lung- clear to P&A, wheeze- none, cough- none , dullness-none, rub- none           Chest wall-  Abd-  Br/ Gen/ Rectal- Not done, not indicated Extrem- cyanosis- none, clubbing, none, atrophy- none, strength- nl Neuro- grossly intact to observation

## 2012-02-25 NOTE — Assessment & Plan Note (Addendum)
Elevated total IgE may indicate she is reacting to more than the identified house dust, grass pollens and dog dander Plan-another discussion of environmental precautions. Singulair, astepro nasal spray, Pepcid H2 antihistamine, continue Allegra Return for skin testing for possible trial of allergy vaccine

## 2012-03-20 ENCOUNTER — Encounter: Payer: Self-pay | Admitting: Internal Medicine

## 2012-03-20 ENCOUNTER — Ambulatory Visit (INDEPENDENT_AMBULATORY_CARE_PROVIDER_SITE_OTHER): Payer: 59 | Admitting: Internal Medicine

## 2012-03-20 VITALS — BP 120/72 | HR 70 | Ht 61.5 in | Wt 122.6 lb

## 2012-03-20 DIAGNOSIS — H04559 Acquired stenosis of unspecified nasolacrimal duct: Secondary | ICD-10-CM

## 2012-03-20 DIAGNOSIS — J309 Allergic rhinitis, unspecified: Secondary | ICD-10-CM

## 2012-03-20 DIAGNOSIS — IMO0002 Reserved for concepts with insufficient information to code with codable children: Secondary | ICD-10-CM

## 2012-03-20 DIAGNOSIS — H1010 Acute atopic conjunctivitis, unspecified eye: Secondary | ICD-10-CM

## 2012-03-20 NOTE — Progress Notes (Signed)
12/29/11 36 yoF never smoker seeking allergy evaluation at the suggestion of her ophthalmologist Dr. Antony Contras She was diagnosed with allergic rhinitis in childhood and has had seasonal problems since then. Usually this is felt in spring and fall as burning and itching of eyes, sneezing. She has not had asthma or significant problems with food or skin rashes. Skin testing as a child without allergy vaccine. No ENT surgery. She is being treated for obstructed lacrimal ducts and allergic conjunctivitis using eyedrops, rarely including a steroid eye drop. Primary recognized trigger has been seasonal pollens. Last prednisone April 2013. 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 House has basement, no recognized mold, no pets or smokers. She does not take flu shot. Told that she snores when nose is congested. Some associated sore throat and headaches. CXR 04/06/11-reviewed IMPRESSION:  No active disease. Improvement in aeration. No new infiltrate.  Original Report Authenticated By: Natasha Mead, M.D.   02/16/12- 30 yoF never smoker seeking allergy evaluation at the suggestion of her ophthalmologist Dr. Antony Contras FOLLOWS FOR: eyes are still itchy and watery; had a flare up near Christmas and was given Pred taper which helped. Review in detail allergy labs with patient. Still using Dr Karma Lew eyedrops. Little wheezing. Has continued to Allegra. Ran out of Dymista nasal spray sample which didn't seem to make a lot of difference. Continues Patanol. Allergy Profile 12/29/2011-total IgE 250.8 with specific elevations for dust mite/house dust,  dog dander, pollens and a few incidentals.  03/20/12- 36 yoF never smoker seeking allergy evaluation at the suggestion of her ophthalmologist Dr. Antony Contras took Allegra about 2 days ago,No OTC sleep aids or cough syrups. Denies pregnant. Good winter- no flu, feels well execpt eyes water and itch.  Allergy skin test 03/20/12-  significant reactions especially for grass pollens, weee pollens dust mite.  ROS-see HPI Constitutional:   No-   weight loss, night sweats, fevers, chills, fatigue, lassitude. HEENT:   No-  headaches, difficulty swallowing, tooth/dental problems, sore throat,       +  sneezing, itching, ear ache, nasal congestion, post nasal drip,  CV:  No-   chest pain, orthopnea, PND, swelling in lower extremities, anasarca, dizziness, palpitations Resp: No-   shortness of breath with exertion or at rest.              No-   productive cough,  No non-productive cough,  No- coughing up of blood.              No-   change in color of mucus.  No- wheezing.   Skin: No-   rash or lesions.  OBJ- Physical Exam General- Alert, Oriented, Affect-appropriate, Distress- none acute Skin- rash-none, lesions- none, excoriation- none Lymphadenopathy- none Head- atraumatic            Eyes- Gross vision intact, PERRLA, minimal conjunctival injection, +watery            Ears- Hearing, canals-normal            Nose- clear, + watery sniffing, no-Septal dev, mucus, polyps, erosion, perforation             Throat- Mallampati III , mucosa+ red posterior pharynx without cobblestone , drainage- none, tonsils- atrophic Neck- flexible , trachea midline, no stridor , thyroid nl, carotid no bruit Chest - symmetrical excursion , unlabored           Heart/CV- RRR , no murmur , no gallop  , no rub,  nl s1 s2                           - JVD- none , edema- none, stasis changes- none, varices- none           Lung- clear to P&A, wheeze- none, cough- none , dullness-none, rub- none           Chest wall-  Abd-  Br/ Gen/ Rectal- Not done, not indicated Extrem- cyanosis- none, clubbing, none, atrophy- none, strength- nl Neuro- grossly intact to observation

## 2012-03-20 NOTE — Patient Instructions (Addendum)
If you have more questions, or decide to start allergy shots, just give [place] a call.

## 2012-03-28 ENCOUNTER — Encounter: Payer: Self-pay | Admitting: Internal Medicine

## 2012-03-28 NOTE — Assessment & Plan Note (Signed)
Additional 20 minutes discussing environmental precautions, symptom control, and the goals, realistic expectations and risks  associated with allergy vaccine Plan-she is considering options. We will see how she feels says the spring pollen season comes through. She will call back if she needs more help or wants to start allergy vaccine.

## 2012-04-11 ENCOUNTER — Encounter: Payer: Self-pay | Admitting: Internal Medicine

## 2012-11-27 ENCOUNTER — Ambulatory Visit: Payer: 59 | Admitting: Internal Medicine

## 2012-11-27 ENCOUNTER — Other Ambulatory Visit: Payer: 59

## 2012-11-27 ENCOUNTER — Encounter: Payer: Self-pay | Admitting: Internal Medicine

## 2012-11-27 ENCOUNTER — Ambulatory Visit (INDEPENDENT_AMBULATORY_CARE_PROVIDER_SITE_OTHER): Payer: 59 | Admitting: Internal Medicine

## 2012-11-27 VITALS — BP 114/76 | HR 76 | Temp 97.8°F | Resp 16 | Ht 61.5 in | Wt 122.8 lb

## 2012-11-27 DIAGNOSIS — L02412 Cutaneous abscess of left axilla: Secondary | ICD-10-CM | POA: Insufficient documentation

## 2012-11-27 DIAGNOSIS — IMO0002 Reserved for concepts with insufficient information to code with codable children: Secondary | ICD-10-CM

## 2012-11-27 MED ORDER — SULFAMETHOXAZOLE-TRIMETHOPRIM 800-160 MG PO TABS: 1.0000 | ORAL_TABLET | Freq: Two times a day (BID) | ORAL | Status: DC

## 2012-11-27 NOTE — Patient Instructions (Signed)

## 2012-11-28 ENCOUNTER — Ambulatory Visit: Payer: 59 | Admitting: Internal Medicine

## 2012-11-29 ENCOUNTER — Ambulatory Visit: Payer: 59 | Admitting: Internal Medicine

## 2012-11-29 ENCOUNTER — Encounter: Payer: Self-pay | Admitting: Internal Medicine

## 2012-11-29 ENCOUNTER — Ambulatory Visit (INDEPENDENT_AMBULATORY_CARE_PROVIDER_SITE_OTHER): Payer: 59 | Admitting: Internal Medicine

## 2012-11-29 VITALS — BP 106/62 | HR 70 | Temp 97.8°F | Resp 16 | Wt 123.0 lb

## 2012-11-29 DIAGNOSIS — L02412 Cutaneous abscess of left axilla: Secondary | ICD-10-CM

## 2012-11-29 DIAGNOSIS — IMO0002 Reserved for concepts with insufficient information to code with codable children: Secondary | ICD-10-CM

## 2012-11-29 NOTE — Patient Instructions (Signed)
MRSA Overview  MRSA stands for methicillin-resistant Staphylococcus aureus. It is a type of bacteria that is resistant to some common antibiotics. It can cause infections in the skin and many other places in the body. Staphylococcus aureus, often called "staph," is a bacteria that normally lives on the skin or in the nose. Staph on the surface of the skin or in the nose does not cause problems. However, if the staph enters the body through a cut, wound, or break in the skin, an infection can happen.  Up until recently, infections with the MRSA type of staph mainly occurred in hospitals and other healthcare settings. There are now increasing problems with MRSA infections in the community as well. Infections with MRSA may be very serious or even life-threatening. Most MRSA infections are acquired in one of two ways:  · Healthcare-associated MRSA (HA-MRSA)  · This can be acquired by people in any healthcare setting. MRSA can be a big problem for hospitalized people, people in nursing homes, people in rehabilitation facilities, people with weakened immune systems, dialysis patients, and those who have had surgery.  · Community-associated MRSA (CA-MRSA)  · Community spread of MRSA is becoming more common. It is known to spread in crowded settings, in jails and prisons, and in situations where there is close skin-to-skin contact, such as during sporting events or in locker rooms. MRSA can be spread through shared items, such as children's toys, razors, towels, or sports equipment.  CAUSES   All staph, including MRSA, are normally harmless unless they enter the body through a scratch, cut, or wound, such as with surgery. All staph, including MRSA, can be spread from person-to-person by touching contaminated objects or through direct contact.  SPECIAL GROUPS  MRSA can present problems for special groups of people. Some of these groups include:  · Breastfeeding women.  · The most common problem is MRSA infection of the  breast (mastitis). There is evidence that MRSA can be passed to an infant from infected breast milk. Your caregiver may recommend that you stop breastfeeding until the mastitis is under control.  · If you are breastfeeding and have a MRSA infection in a place other than the breast, you may usually continue breastfeeding while under treatment. If taking antibiotics, ask your caregiver if it is safe to continue breastfeeding while taking your prescribed medicines.  · Neonates (babies from birth to 1 month old) and infants (babies from 1 month to 1 year old).  · There is evidence that MRSA can be passed to a newborn at birth if the mother has MRSA on the skin, in or around the birth canal, or an infection in the uterus, cervix, or vagina. MRSA infection can have the same appearance as a normal newborn or infant rash or several other skin infections. This can make it hard to diagnose MRSA.  · Immune compromised people.  · If you have an immune system problem, you may have a higher chance of developing a MRSA infection.  · People after any type of surgery.  · Staph in general, including MRSA, is the most common cause of infections occurring at the site of recent surgery.  · People on long-term steroid medicines.  · These kinds of medicines can lower your resistance to infection. This can increase your chance of getting MRSA.  · People who have had frequent hospitalizations, live in nursing homes or other residential care facilities, have venous or urinary catheters, or have taken multiple courses of antibiotic therapy for any reason.    DIAGNOSIS   Diagnosis of MRSA is done by cultures of fluid samples that may come from:  · Swabs taken from cuts or wounds in infected areas.  · Nasal swabs.  · Saliva or deep cough specimens from the lungs (sputum).  · Urine.  · Blood.  Many people are "colonized" with MRSA but have no signs of infection. This means that people carry the MRSA germ on their skin or in their nose and may  never develop MRSA infection.   TREATMENT   Treatment varies and is based on how serious, how deep, or how extensive the infection is. For example:  · Some skin infections, such as a small boil or abscess, may be treated by draining yellowish-white fluid (pus) from the site of the infection.  · Deeper or more widespread soft tissue infections are usually treated with surgery to drain pus and with antibiotic medicine given by vein or by mouth. This may be recommended even if you are pregnant.  · Serious infections may require a hospital stay.  If antibiotics are given, they may be needed for several weeks.  PREVENTION   Because many people are colonized with staph, including MRSA, preventing the spread of the bacteria from person-to-person is most important. The best way to prevent the spread of bacteria and other germs is through proper hand washing or by using alcohol-based hand disinfectants. The following are other ways to help prevent MRSA infection within the hospital and community settings.   · Healthcare settings:  · Strict hand washing or hand disinfection procedures need to be followed before and after touching every patient.  · Patients infected with MRSA are placed in isolation to prevent the spread of the bacteria.  · Healthcare workers need to wear disposable gowns and gloves when touching or caring for patients infected with MRSA. Visitors may also be asked to wear a gown and gloves.  · Hospital surfaces need to be disinfected frequently.  · Community settings:  · Wash your hands frequently with soap and water for at least 15 seconds. Otherwise, use alcohol-based hand disinfectants when soap and water is not available.  · Make sure people who live with you wash their hands often, too.  · Do not share personal items. For example, avoid sharing razors and other personal hygiene items, towels, clothing, and athletic equipment.  · Wash and dry your clothes and bedding at the warmest temperatures  recommended on the labels.  · Keep wounds covered. Pus from infected sores may contain MRSA and other bacteria. Keep cuts and abrasions clean and covered with germ-free (sterile), dry bandages until they are healed.  · If you have a wound that appears infected, ask your caregiver if a culture for MRSA and other bacteria should be done.  · If you are breastfeeding, talk to your caregiver about MRSA. You may be asked to temporarily stop breastfeeding.  HOME CARE INSTRUCTIONS   · Take your antibiotics as directed. Finish them even if you start to feel better.  · Avoid close contact with those around you as much as possible. Do not use towels, razors, toothbrushes, bedding, or other items that will be used by others.  · To fight the infection, follow your caregiver's instructions for wound care. Wash your hands before and after changing your bandages.  · If you have an intravascular device, such as a catheter, make sure you know how to care for it.  · Be sure to tell any healthcare providers that you have MRSA   so they are aware of your infection.  SEEK IMMEDIATE MEDICAL CARE IF:   · The infection appears to be getting worse. Signs include:  · Increased warmth, redness, or tenderness around the wound site.  · A red line that extends from the infection site.  · A dark color in the area around the infection.  · Wound drainage that is tan, yellow, or green.  · A bad smell coming from the wound.  · You feel sick to your stomach (nauseous) and throw up (vomit) or cannot keep medicine down.  · You have a fever.  · Your baby is older than 3 months with a rectal temperature of 102° F (38.9° C) or higher.  · Your baby is 3 months old or younger with a rectal temperature of 100.4° F (38° C) or higher.  · You have difficulty breathing.  MAKE SURE YOU:   · Understand these instructions.  · Will watch your condition.  · Will get help right away if you are not doing well or get worse.  Document Released: 01/30/2005 Document Revised:  04/24/2011 Document Reviewed: 05/04/2010  [employer]® Patient Information ©2014 [employer].

## 2012-11-29 NOTE — Progress Notes (Signed)
  Subjective:    Patient ID: Briana Sanchez, female    DOB: 29-Sep-1975, 37 y.o.   MRN: [id number]  HPI Comments: For the past 2 days she has noticed an enlarging, red, painful boil in her left armpit. Her mother has a history of boils on her legs.     Review of Systems  Constitutional: Negative.  Negative for fever, chills, diaphoresis, appetite change and fatigue.  HENT: Negative.   Eyes: Negative.   Respiratory: Negative.  Negative for cough, chest tightness, shortness of breath, wheezing and stridor.   Cardiovascular: Negative.  Negative for chest pain, palpitations and leg swelling.  Gastrointestinal: Negative.  Negative for abdominal pain.  Endocrine: Negative.   Genitourinary: Negative.   Musculoskeletal: Negative.   Skin: Positive for rash and wound. Negative for color change and pallor.  Allergic/Immunologic: Negative.   Neurological: Negative.   Hematological: Negative.  Negative for adenopathy. Does not bruise/bleed easily.  Psychiatric/Behavioral: Negative.        Objective:   Physical Exam  Vitals reviewed. Constitutional: She is oriented to person, place, and time. She appears well-developed and well-nourished. No distress.  HENT:  Head: Normocephalic and atraumatic.  Eyes: Conjunctivae are normal. Right eye exhibits no discharge. Left eye exhibits no discharge. No scleral icterus.  Neck: Normal range of motion. Neck supple. No JVD present. No tracheal deviation present. No thyromegaly present.  Cardiovascular: Normal rate, normal heart sounds and intact distal pulses.  Exam reveals no gallop and no friction rub.   No murmur heard. Pulmonary/Chest: Effort normal and breath sounds normal. No stridor. No respiratory distress. She has no wheezes. She has no rales. She exhibits no tenderness.  Abdominal: Soft. Bowel sounds are normal. She exhibits no distension and no mass. There is no tenderness. There is no rebound and no guarding.  Musculoskeletal: Normal range of  motion. She exhibits no edema and no tenderness.  The left axilla was prepped and draped in sterile fashion and the skin was cleaned with betadine. Local anesthesia was obtained with 2% lido with epi. A 3 mm punch incision was made and a loculated cavity filled with purulent exudate was found, the cavity was irrigated with H2O2 and then it was packed with iodoform. A clx was sent, she tolerated this well.  Lymphadenopathy:    She has no cervical adenopathy.  Neurological: She is oriented to person, place, and time.  Skin: Skin is warm and dry. No rash noted. She is not diaphoretic. No erythema. No pallor.  In the left axilla there is a 1.5 cm raised red fluctuant and indurated warm tender abscess          Assessment & Plan:

## 2012-11-29 NOTE — Progress Notes (Signed)
  Subjective:    Patient ID: Briana Sanchez, female    DOB: Aug 09, 1975, 37 y.o.   MRN: [id number]  Wound Check She was originally treated 2 to 3 days ago. Previous treatment included I&D of abscess and oral antibiotics. There has been no drainage from the wound. There is no redness present. There is no swelling present. The pain has no pain. She has no difficulty moving the affected extremity or digit.      Review of Systems  Constitutional: Negative.  Negative for fever, chills, diaphoresis, appetite change and fatigue.  HENT: Negative.   Eyes: Negative.   Respiratory: Negative.   Cardiovascular: Negative.   Gastrointestinal: Negative.  Negative for abdominal pain and diarrhea.  Endocrine: Negative.   Genitourinary: Negative.   Musculoskeletal: Negative.  Negative for arthralgias, myalgias and neck stiffness.  Skin: Negative.  Negative for color change, rash and wound.  Allergic/Immunologic: Negative.   Neurological: Negative.   Hematological: Negative.  Negative for adenopathy. Does not bruise/bleed easily.  Psychiatric/Behavioral: Negative.        Objective:   Physical Exam  Musculoskeletal:  Left axilla - the packing was removed and there is no more exudate or abscess, there are no loculations. A dressing was placed over the sight.          Assessment & Plan:

## 2012-11-29 NOTE — Assessment & Plan Note (Signed)
I and D was done This looks like MRSA so SMX-TMP was prescribed I await the clx results

## 2012-11-29 NOTE — Assessment & Plan Note (Signed)
The clx is + for staph ( I believe this will be MRSA) She will cont SMX-TMP She was given pt ed material about MRSA

## 2012-11-30 LAB — WOUND CULTURE

## 2012-12-01 ENCOUNTER — Encounter: Payer: Self-pay | Admitting: Internal Medicine

## 2012-12-19 ENCOUNTER — Other Ambulatory Visit: Payer: Self-pay

## 2013-02-23 ENCOUNTER — Other Ambulatory Visit: Payer: Self-pay | Admitting: Internal Medicine

## 2013-02-24 NOTE — Telephone Encounter (Signed)
CY, Please advise if okay to refill. Pt was last seen 03-20-12 for skin testing and follow up if symptoms worsen or fail to improve. Thanks.

## 2013-02-24 NOTE — Telephone Encounter (Signed)
Ok to refill x 3 months. She will either need to plan occasional f./u here, or ask her PCP to refill this going forward.

## 2013-05-28 ENCOUNTER — Other Ambulatory Visit: Payer: Self-pay | Admitting: Internal Medicine

## 2013-06-26 ENCOUNTER — Other Ambulatory Visit: Payer: Self-pay | Admitting: Internal Medicine

## 2013-06-30 ENCOUNTER — Telehealth: Payer: Self-pay | Admitting: Internal Medicine

## 2013-06-30 MED ORDER — MONTELUKAST SODIUM 10 MG PO TABS: ORAL_TABLET | ORAL | Status: DC

## 2013-06-30 NOTE — Telephone Encounter (Signed)
Pt is aware that appt needed for further refills. Pt made appt with [facility] for 08/01/13 @ 9am Pt states that she is completely out of Singulair - 30 day supply sent to pharmacy. Pt aware that if she does not keep her upcoming appt, she will not be able to get any further refills thereafter.  Pt expressed understanding. Singulair 36m tab 1 po qhs # 30 x 0RF sent to [facility]  Nothing further needed.

## 2013-06-30 NOTE — Telephone Encounter (Signed)
Pt over due for appt. Needs OV before we can send in refill LMTCB x1

## 2013-08-01 ENCOUNTER — Encounter: Payer: Self-pay | Admitting: Internal Medicine

## 2013-08-01 ENCOUNTER — Ambulatory Visit (INDEPENDENT_AMBULATORY_CARE_PROVIDER_SITE_OTHER): Payer: 59 | Admitting: Internal Medicine

## 2013-08-01 VITALS — BP 114/68 | HR 63 | Ht 61.5 in | Wt 122.0 lb

## 2013-08-01 DIAGNOSIS — H1013 Acute atopic conjunctivitis, bilateral: Secondary | ICD-10-CM

## 2013-08-01 DIAGNOSIS — H1010 Acute atopic conjunctivitis, unspecified eye: Secondary | ICD-10-CM

## 2013-08-01 DIAGNOSIS — J309 Allergic rhinitis, unspecified: Secondary | ICD-10-CM

## 2013-08-01 MED ORDER — MONTELUKAST SODIUM 10 MG PO TABS: ORAL_TABLET | ORAL | Status: DC

## 2013-08-01 NOTE — Patient Instructions (Signed)
Script sent refilling Singulair  Ok to continue allegra at the lowest dose that works well for you.   Please call if needed

## 2013-08-01 NOTE — Progress Notes (Signed)
12/29/11 38 yoF never smoker seeking allergy evaluation at the suggestion of her ophthalmologist Dr. Katy Apo She was diagnosed with allergic rhinitis in childhood and has had seasonal problems since then. Usually this is felt in spring and fall as burning and itching of eyes, sneezing. She has not had asthma or significant problems with food or skin rashes. Skin testing as a child without allergy vaccine. No ENT surgery. She is being treated for obstructed lacrimal ducts and allergic conjunctivitis using eyedrops, rarely including a steroid eye drop. Primary recognized trigger has been seasonal pollens. Last prednisone April 2013. Married living with husband and 54-year-old daughter, working in an office environment. House has basement, no recognized mold, no pets or smokers. She does not take flu shot. Told that she snores when nose is congested. Some associated sore throat and headaches. CXR 04/06/11-reviewed IMPRESSION:  No active disease. Improvement in aeration. No new infiltrate.  Original Report Authenticated By: Lahoma Crocker, M.D.   02/16/12- 38 yoF never smoker seeking allergy evaluation at the suggestion of her ophthalmologist Dr. Katy Apo FOLLOWS FOR: eyes are still itchy and watery; had a flare up near Christmas and was given Pred taper which helped. Review in detail allergy labs with patient. Still using Dr Marthe Patch eyedrops. Little wheezing. Has continued to Shafer. Ran out of Dymista nasal spray sample which didn't seem to make a lot of difference. Continues Patanol. Allergy Profile 12/29/2011-total IgE 250.8 with specific elevations for dust mite/house dust,  dog dander, pollens and a few incidentals.  03/20/12- 38 yoF never smoker seeking allergy evaluation at the suggestion of her ophthalmologist Dr. Katy Apo took Allegra about 2 days ago,No OTC sleep aids or cough syrups. Denies pregnant. Good winter- no flu, feels well execpt eyes water and itch.  Allergy skin test 03/20/12-  significant reactions especially for grass pollens, weed pollens, dust mite.  08/01/13 39 yoF never smoker followed for allergic conjunctivitis, allergic conjunctivitis ophthalmologist Dr. Katy Apo FOLLOWS FOR:  Allergies improved since last OV-- still has slight PND and itchy eyes Taking Singulair and 2 Allegra.  We reviewed her skin test results again. She feels much better controlled using to Allegra tablets plus Singulair. Medication talk done. This was a good spring.  ROS-see HPI Constitutional:   No-   weight loss, night sweats, fevers, chills, fatigue, lassitude. HEENT:   No-  headaches, difficulty swallowing, tooth/dental problems, sore throat,       +  sneezing, itching, ear ache, nasal congestion, post nasal drip,  CV:  No-   chest pain, orthopnea, PND, swelling in lower extremities, anasarca, dizziness, palpitations Resp: No-   shortness of breath with exertion or at rest.              No-   productive cough,  No non-productive cough,  No- coughing up of blood.              No-   change in color of mucus.  No- wheezing.   Skin: No-   rash or lesions.  OBJ- Physical Exam General- Alert, Oriented, Affect-appropriate, Distress- none acute Skin- rash-none, lesions- none, excoriation- none Lymphadenopathy- none Head- atraumatic            Eyes- Gross vision intact, PERRLA, minimal conjunctival injection,             Ears- Hearing, canals-normal            Nose- + turbinate edema, , no-Septal dev, mucus, polyps, erosion, perforation  Throat- Mallampati III , mucosa-clear , drainage- none, tonsils- atrophic Neck- flexible , trachea midline, no stridor , thyroid nl, carotid no bruit Chest - symmetrical excursion , unlabored           Heart/CV- RRR , no murmur , no gallop  , no rub, nl s1 s2                           - JVD- none , edema- none, stasis changes- none, varices- none           Lung- clear to P&A, wheeze- none, cough- none , dullness-none, rub- none            Chest wall-  Abd-  Br/ Gen/ Rectal- Not done, not indicated Extrem- cyanosis- none, clubbing, none, atrophy- none, strength- nl Neuro- grossly intact to observation

## 2013-08-18 ENCOUNTER — Encounter: Payer: Self-pay | Admitting: Internal Medicine

## 2013-08-18 ENCOUNTER — Ambulatory Visit (INDEPENDENT_AMBULATORY_CARE_PROVIDER_SITE_OTHER): Payer: 59 | Admitting: Internal Medicine

## 2013-08-18 VITALS — BP 94/60 | Temp 98.3°F | Wt 123.0 lb

## 2013-08-18 DIAGNOSIS — J029 Acute pharyngitis, unspecified: Secondary | ICD-10-CM

## 2013-08-18 DIAGNOSIS — Z20818 Contact with and (suspected) exposure to other bacterial communicable diseases: Secondary | ICD-10-CM

## 2013-08-18 DIAGNOSIS — Z2089 Contact with and (suspected) exposure to other communicable diseases: Secondary | ICD-10-CM

## 2013-08-18 DIAGNOSIS — J020 Streptococcal pharyngitis: Secondary | ICD-10-CM | POA: Insufficient documentation

## 2013-08-18 LAB — POCT RAPID STREP A (OFFICE): Rapid Strep A Screen: POSITIVE — AB

## 2013-08-18 MED ORDER — AMOXICILLIN 500 MG PO CAPS: 500.0000 mg | ORAL_CAPSULE | Freq: Two times a day (BID) | ORAL | Status: DC

## 2013-08-18 NOTE — Progress Notes (Signed)
Pre visit review using our clinic review tool, if applicable. No additional management support is needed unless otherwise documented below in the visit note.  Chief Complaint  Patient presents with  . Sore Throat    Started yesterday.  . Nasal Congestion    HPI: Patient comes in today for SDA for  new problem evaluation. PCP DR Ronnald Ramp at [facility] office  See dr Annamaria Boots fo allergy Make sure not strep or such neck pain.   Sore throat for 2 days  Ant neck sore  Fever no : jurts to swallow  No cough some nasal congestion and dc  Husband had strep throat    A few weeks  Ago .  Daughter is 5 years.  Not sick  ROS: See pertinent positives and negatives per HPI. No cough cp sob new rash  nvd Works Pharmacologist   Past Medical History  Diagnosis Date  . Tension headache 2010  . Allergic rhinitis     Family History  Problem Relation Age of Onset  . Alcohol abuse Other   . Drug abuse Other   . Arthritis Other   . Lung cancer Paternal Grandfather   . Hypertension Other   . Emphysema Maternal Grandmother   . Rheum arthritis Maternal Grandmother   . Rheum arthritis Mother   . Cervical cancer Maternal Grandmother     History   Social History  . Marital Status: Married    Spouse Name: N/A    Number of Children: 1  . Years of Education: N/A   Occupational History  . brand Freight forwarder   .  [employer]   Social History Main Topics  . Smoking status: Never Smoker   . Smokeless tobacco: Never Used  . Alcohol Use: 1.2 oz/week    2 Glasses of wine per week  . Drug Use: No  . Sexual Activity: Yes    Birth Control/ Protection: Pill   Other Topics Concern  . None   Social History Narrative   Caffienated drinks-Rare   Seat belt use often-yes   Regular Exercise-yes   Smoke alarm in the home-yes   Firearms/guns in the home-no   History of physical abuse-no                Outpatient Encounter Prescriptions as of 08/18/2013  Medication Sig  . azelastine (ASTELIN) 137 MCG/SPRAY  nasal spray 1-2 sprays each nostril once or twice daily as needed  . fexofenadine (ALLEGRA) 180 MG tablet Take 1 tablet (180 mg total) by mouth daily.  . montelukast (SINGULAIR) 10 MG tablet TAKE 1 TABLET AT BEDTIME  . Multiple Vitamin (MULTIVITAMIN) tablet Take 1 tablet by mouth daily.  Donnetta Hail Estradiol (ORTHO-CYCLEN, 28, PO) Take by mouth.    Marland Kitchen olopatadine (PATANOL) 0.1 % ophthalmic solution Place 1 drop into both eyes 2 (two) times daily.  Marland Kitchen amoxicillin (AMOXIL) 500 MG capsule Take 1 capsule (500 mg total) by mouth 2 (two) times daily. Strep throat    EXAM:  BP 94/60  Temp(Src) 98.3 F (36.8 C) (Oral)  Wt 123 lb (55.792 kg)  Body mass index is 22.87 kg/(m^2).  GENERAL: vitals reviewed and listed above, alert, oriented, appears well hydrated and in no acute distress HEENT: atraumatic, conjunctiva  clear, no obvious abnormalities on inspection of external nose and ears  tms clear  Nares congested minimal OP :   Red 1-2 +  No exudate tonsil 1 + NECK: no obvious masses on inspection tender ac nodes  Shoddy pc nodes  LUNGS: clear to auscultation bilaterally, no wheezes, rales or rhonchi, good air movement CV: HRRR, no clubbing cyanosis or  peripheral edema nl cap refill  MS: moves all extremities without noticeable focal  abnormality PSYCH: pleasant and cooperative, no obvious depression or anxiety ASSESSMENT AND PLAN:  Discussed the following assessment and plan: Options discussed  Streptococcal pharyngitis - no complications begin antibiotic  pos rapid strep  Exposure to Streptococcal pharyngitis - Plan: POCT rapid strep A, CANCELED: Culture, Group A Strep  Acute pharyngitis, unspecified pharyngitis type - Plan: POCT rapid strep A, CANCELED: Culture, Group A Strep   Expectant management. counsleing begin antibiotic  -Patient advised to return or notify health care team  if symptoms worsen ,persist or new concerns arise.  Patient Instructions  Take antibiotic as  directed suspect the congestion is allergy.  You test is positive for strep throat.   Strep Throat Strep throat is an infection of the throat caused by a bacteria named Streptococcus pyogenes. Your caregiver may call the infection streptococcal "tonsillitis" or "pharyngitis" depending on whether there are signs of inflammation in the tonsils or back of the throat. Strep throat is most common in children aged 40-15 years during the cold months of the year, but it can occur in people of any age during any season. This infection is spread from person to person (contagious) through coughing, sneezing, or other close contact. SYMPTOMS   Fever or chills.  Painful, swollen, red tonsils or throat.  Pain or difficulty when swallowing.  White or yellow spots on the tonsils or throat.  Swollen, tender lymph nodes or "glands" of the neck or under the jaw.  Red rash all over the body (rare). DIAGNOSIS  Many different infections can cause the same symptoms. A test must be done to confirm the diagnosis so the right treatment can be given. A "rapid strep test" can help your caregiver make the diagnosis in a few minutes. If this test is not available, a light swab of the infected area can be used for a throat culture test. If a throat culture test is done, results are usually available in a day or two. TREATMENT  Strep throat is treated with antibiotic medicine. HOME CARE INSTRUCTIONS   Gargle with 1 tsp of salt in 1 cup of warm water, 3-4 times per day or as needed for comfort.  Family members who also have a sore throat or fever should be tested for strep throat and treated with antibiotics if they have the strep infection.  Make sure everyone in your household washes their hands well.  Do not share food, drinking cups, or personal items that could cause the infection to spread to others.  You may need to eat a soft food diet until your sore throat gets better.  Drink enough water and fluids to  keep your urine clear or pale yellow. This will help prevent dehydration.  Get plenty of rest.  Stay home from school, daycare, or work until you have been on antibiotics for 24 hours.  Only take over-the-counter or prescription medicines for pain, discomfort, or fever as directed by your caregiver.  If antibiotics are prescribed, take them as directed. Finish them even if you start to feel better. SEEK MEDICAL CARE IF:   The glands in your neck continue to enlarge.  You develop a rash, cough, or earache.  You cough up green, yellow-brown, or bloody sputum.  You have pain or discomfort not controlled by medicines.  Your problems seem to be  getting worse rather than better. SEEK IMMEDIATE MEDICAL CARE IF:   You develop any new symptoms such as vomiting, severe headache, stiff or painful neck, chest pain, shortness of breath, or trouble swallowing.  You develop severe throat pain, drooling, or changes in your voice.  You develop swelling of the neck, or the skin on the neck becomes red and tender.  You have a fever.  You develop signs of dehydration, such as fatigue, dry mouth, and decreased urination.  You become increasingly sleepy, or you cannot wake up completely. Document Released: 01/28/2000 Document Revised: 01/17/2012 Document Reviewed: 03/31/2010 [facility] Patient Information 2015 [place], [place]. This information is not intended to replace advice given to you by your health care provider. Make sure you discuss any questions you have with your health care provider.      Standley Brooking. Danitra Payano M.D.

## 2013-08-18 NOTE — Patient Instructions (Addendum)
Take antibiotic as directed suspect the congestion is allergy.  You test is positive for strep throat.   Strep Throat Strep throat is an infection of the throat caused by a bacteria named Streptococcus pyogenes. Your caregiver may call the infection streptococcal "tonsillitis" or "pharyngitis" depending on whether there are signs of inflammation in the tonsils or back of the throat. Strep throat is most common in children aged 40-15 years during the cold months of the year, but it can occur in people of any age during any season. This infection is spread from person to person (contagious) through coughing, sneezing, or other close contact. SYMPTOMS   Fever or chills.  Painful, swollen, red tonsils or throat.  Pain or difficulty when swallowing.  White or yellow spots on the tonsils or throat.  Swollen, tender lymph nodes or "glands" of the neck or under the jaw.  Red rash all over the body (rare). DIAGNOSIS  Many different infections can cause the same symptoms. A test must be done to confirm the diagnosis so the right treatment can be given. A "rapid strep test" can help your caregiver make the diagnosis in a few minutes. If this test is not available, a light swab of the infected area can be used for a throat culture test. If a throat culture test is done, results are usually available in a day or two. TREATMENT  Strep throat is treated with antibiotic medicine. HOME CARE INSTRUCTIONS   Gargle with 1 tsp of salt in 1 cup of warm water, 3-4 times per day or as needed for comfort.  Family members who also have a sore throat or fever should be tested for strep throat and treated with antibiotics if they have the strep infection.  Make sure everyone in your household washes their hands well.  Do not share food, drinking cups, or personal items that could cause the infection to spread to others.  You may need to eat a soft food diet until your sore throat gets better.  Drink enough  water and fluids to keep your urine clear or pale yellow. This will help prevent dehydration.  Get plenty of rest.  Stay home from school, daycare, or work until you have been on antibiotics for 24 hours.  Only take over-the-counter or prescription medicines for pain, discomfort, or fever as directed by your caregiver.  If antibiotics are prescribed, take them as directed. Finish them even if you start to feel better. SEEK MEDICAL CARE IF:   The glands in your neck continue to enlarge.  You develop a rash, cough, or earache.  You cough up green, yellow-brown, or bloody sputum.  You have pain or discomfort not controlled by medicines.  Your problems seem to be getting worse rather than better. SEEK IMMEDIATE MEDICAL CARE IF:   You develop any new symptoms such as vomiting, severe headache, stiff or painful neck, chest pain, shortness of breath, or trouble swallowing.  You develop severe throat pain, drooling, or changes in your voice.  You develop swelling of the neck, or the skin on the neck becomes red and tender.  You have a fever.  You develop signs of dehydration, such as fatigue, dry mouth, and decreased urination.  You become increasingly sleepy, or you cannot wake up completely. Document Released: 01/28/2000 Document Revised: 01/17/2012 Document Reviewed: 03/31/2010 [facility] Patient Information 2015 [place], [place]. This information is not intended to replace advice given to you by your health care provider. Make sure you discuss any questions you have  with your health care provider.  

## 2013-10-12 NOTE — Assessment & Plan Note (Signed)
She has felt well enough controlled with high-dose antihistamine plus Singulair. Prefers to avoid commitment of allergy vaccine unless she gets worse. Plan-medications as discussed

## 2013-11-28 ENCOUNTER — Other Ambulatory Visit: Payer: Self-pay

## 2014-03-31 ENCOUNTER — Ambulatory Visit (INDEPENDENT_AMBULATORY_CARE_PROVIDER_SITE_OTHER): Payer: 59 | Admitting: Internal Medicine

## 2014-03-31 ENCOUNTER — Other Ambulatory Visit (INDEPENDENT_AMBULATORY_CARE_PROVIDER_SITE_OTHER): Payer: 59

## 2014-03-31 ENCOUNTER — Encounter: Payer: Self-pay | Admitting: Internal Medicine

## 2014-03-31 VITALS — BP 90/64 | HR 66 | Temp 98.1°F | Ht 62.0 in | Wt 123.0 lb

## 2014-03-31 DIAGNOSIS — H1013 Acute atopic conjunctivitis, bilateral: Secondary | ICD-10-CM

## 2014-03-31 DIAGNOSIS — M5412 Radiculopathy, cervical region: Secondary | ICD-10-CM

## 2014-03-31 DIAGNOSIS — J309 Allergic rhinitis, unspecified: Secondary | ICD-10-CM

## 2014-03-31 DIAGNOSIS — R1013 Epigastric pain: Secondary | ICD-10-CM

## 2014-03-31 LAB — CBC WITH DIFFERENTIAL/PLATELET
BASOS ABS: 0 10*3/uL (ref 0.0–0.1)
Basophils Relative: 0.3 % (ref 0.0–3.0)
EOS PCT: 2.1 % (ref 0.0–5.0)
Eosinophils Absolute: 0.2 10*3/uL (ref 0.0–0.7)
HCT: 44.2 % (ref 36.0–46.0)
HEMOGLOBIN: 15.3 g/dL — AB (ref 12.0–15.0)
LYMPHS ABS: 1.8 10*3/uL (ref 0.7–4.0)
LYMPHS PCT: 22.7 % (ref 12.0–46.0)
MCHC: 34.5 g/dL (ref 30.0–36.0)
MCV: 88.2 fl (ref 78.0–100.0)
MONO ABS: 0.8 10*3/uL (ref 0.1–1.0)
MONOS PCT: 10.9 % (ref 3.0–12.0)
NEUTROS PCT: 64 % (ref 43.0–77.0)
Neutro Abs: 4.9 10*3/uL (ref 1.4–7.7)
Platelets: 218 10*3/uL (ref 150.0–400.0)
RBC: 5.01 Mil/uL (ref 3.87–5.11)
RDW: 12 % (ref 11.5–15.5)
WBC: 7.7 10*3/uL (ref 4.0–10.5)

## 2014-03-31 LAB — HEPATIC FUNCTION PANEL
ALT: 9 U/L (ref 0–35)
AST: 13 U/L (ref 0–37)
Albumin: 3.7 g/dL (ref 3.5–5.2)
Alkaline Phosphatase: 57 U/L (ref 39–117)
BILIRUBIN DIRECT: 0.1 mg/dL (ref 0.0–0.3)
TOTAL PROTEIN: 7 g/dL (ref 6.0–8.3)
Total Bilirubin: 0.4 mg/dL (ref 0.2–1.2)

## 2014-03-31 LAB — LIPASE: Lipase: 12 U/L (ref 11.0–59.0)

## 2014-03-31 LAB — AMYLASE: AMYLASE: 30 U/L (ref 27–131)

## 2014-03-31 MED ORDER — RANITIDINE HCL 150 MG PO TABS: 150.0000 mg | ORAL_TABLET | Freq: Two times a day (BID) | ORAL | Status: DC

## 2014-03-31 MED ORDER — GABAPENTIN 100 MG PO CAPS: ORAL_CAPSULE | ORAL | Status: DC

## 2014-03-31 NOTE — Progress Notes (Signed)
Pre visit review using our clinic review tool, if applicable. No additional management support is needed unless otherwise documented below in the visit note. 

## 2014-03-31 NOTE — Patient Instructions (Signed)
  Your next office appointment will be determined based upon review of your pending labs. Those instructions will be transmitted to you through My Chart  Critical values will be called. Followup as needed for any active or acute issue. Please report any significant change in your symptoms.  Reflux of gastric acid may be asymptomatic as this may occur mainly during sleep.The triggers for reflux  include stress; the "aspirin family" ; alcohol; peppermint; and caffeine (coffee, tea, cola, and chocolate). The aspirin family would include aspirin and the nonsteroidal agents such as ibuprofen &  Naproxen. Tylenol would not cause reflux. If having symptoms ; food & drink should be avoided for @ least 2 hours before going to bed.   Please keep a diary of your headaches . Document  each occurrence on the calendar with notation of : #1 any prodrome ( any non headache symptom such as marked fatigue,visual changes, ,etc ) which precedes actual headache ; #2) severity on 1-10 scale; #3) any triggers ( food/ drink,enviromenntal or weather changes ,physical or emotional stress) in 8-12 hour period prior to the headache; & #4) response to any medications or other intervention. Please review "Headache" @ [facility] for additional information. Use a cervical memory foam pillow to prevent hyperextension or hyperflexion of the cervical spine.  Plain Mucinex (NOT D) for thick secretions ;force NON dairy fluids .   Nasal cleansing in the shower as discussed with lather of mild shampoo.After 10 seconds wash off lather while  exhaling through nostrils. Make sure that all residual soap is removed to prevent irritation.  Flonase OR Nasacort AQ 1 spray in each nostril twice a day as needed. Use the "crossover" technique into opposite nostril spraying toward opposite ear @ 45 degree angle, not straight up into nostril.  Plain Allegra (NOT D )  160 daily , Loratidine 10 mg , OR Zyrtec 10 mg @ bedtime  as needed for itchy eyes &  sneezing.

## 2014-03-31 NOTE — Progress Notes (Signed)
   Subjective:    Patient ID: Briana Sanchez, female    DOB: 07/22/1975, 39 y.o.   MRN: [id number]  HPI  She developed epigastric abdominal pain which woke her the evening of 03/29/14 @ 11:30 pm. It was described as constant up to level VIII in the midepigastrium. She vomited food debris twice.  She had eaten lasagna at 6 PM. Family members had eaten this as well without symptoms  On 03/30/14 showed low-grade fever up to 100F associated with chills and sweats. She took 2 Extra Strength Tylenol at 7:30 PM with resolution of the symptoms.  Antacids 4 tablets 4 times a day on 2/15 and one dose of 4 tablets 2/16 with some partial benefit  Her stools been darker but not melenous. Urine  been dark yellow but not Coke-colored.  She averages 2 alcoholic beverages a week. She does not ingest peppermint. She occasionally has chocolate. She is not a smoker. She does take up to 5 doses of Extra Strength Excedrin per week for headaches.  Family history is positive for peptic ulcers with perforation her mother.  Review of Systems   She denies dysphagia, hematemesis, weight change, dysuria, pyuria, or hematuria. She has chronic rhinitis and conjunctivitis which is felt to be allergic. She is on multiple medications for this.  The headaches are described as waking her in the morning several times a week. They start in the right posterior cervical area and radiate into the bilateral temples. They're described as throbbing.    Objective:   Physical Exam  Pertinent positive findings include: There is tenderness to palpation in the abdomen without palpable masses ;she has no organomegaly. Pedal pulses are equal but decreased.  General appearance :adequately nourished; in no distress. Eyes: No conjunctival inflammation or scleral icterus is present. Oral exam: Dental hygiene is good. Lips and gums are healthy appearing.There is no oropharyngeal erythema or exudate noted.  Heart:  Normal rate and  regular rhythm. S1 and S2 normal without gallop, murmur, click, rub or other extra sounds   Lungs:Chest clear to auscultation; no wheezes, rhonchi,rales ,or rubs present.No increased work of breathing.  Abdomen: bowel sounds normal, abdomen soft .  No guarding or rebound.  Vascular : all pulses equal ; no bruits present. Skin:Warm & dry.  Intact without suspicious lesions or rashes ; no jaundice or tenting Lymphatic: No lymphadenopathy is noted about the head, neck, axilla areas.  Neuro: Strength, tone & DTRs normal.No cervical nerve deficit.  Genitourinary: Genitalia normal except for  varices in left scrotum.. Rectal tone is normal. The prostate is normal in size; there is no induration or nodule present.         Assessment & Plan:  #1 abdominal pain; probable symptomatic GERD. Doubt pancreatitis or cholecystitis #2 cervical headaches #3 extrinsic rhinitis See orders

## 2014-06-09 ENCOUNTER — Other Ambulatory Visit: Payer: Self-pay | Admitting: Obstetrics and Gynecology

## 2014-06-10 LAB — CYTOLOGY - PAP

## 2014-06-15 ENCOUNTER — Ambulatory Visit (INDEPENDENT_AMBULATORY_CARE_PROVIDER_SITE_OTHER): Payer: 59 | Admitting: Internal Medicine

## 2014-06-15 ENCOUNTER — Encounter: Payer: Self-pay | Admitting: Internal Medicine

## 2014-06-15 VITALS — BP 90/68 | HR 66 | Ht 61.5 in | Wt 124.0 lb

## 2014-06-15 DIAGNOSIS — H1013 Acute atopic conjunctivitis, bilateral: Secondary | ICD-10-CM

## 2014-06-15 DIAGNOSIS — J309 Allergic rhinitis, unspecified: Principal | ICD-10-CM

## 2014-06-15 MED ORDER — MONTELUKAST SODIUM 10 MG PO TABS: ORAL_TABLET | ORAL | Status: DC

## 2014-06-15 NOTE — Patient Instructions (Addendum)
Script sent refilling Singulair.    If you want you can try taking this off and on, a week at a time to see if it is really helping. Ok to stop it and restart it again later on, if you wish.  Ok to skip any of the allergy meds from time to time, to see what makes a difference. I will leave that up to you.  Please call if we can help

## 2014-06-15 NOTE — Progress Notes (Signed)
12/29/11 43 yoF never smoker seeking allergy evaluation at the suggestion of her ophthalmologist Dr. Katy Apo She was diagnosed with allergic rhinitis in childhood and has had seasonal problems since then. Usually this is felt in spring and fall as burning and itching of eyes, sneezing. She has not had asthma or significant problems with food or skin rashes. Skin testing as a child without allergy vaccine. No ENT surgery. She is being treated for obstructed lacrimal ducts and allergic conjunctivitis using eyedrops, rarely including a steroid eye drop. Primary recognized trigger has been seasonal pollens. Last prednisone April 2013. Married living with husband and 86-year-old daughter, working in an office environment. House has basement, no recognized mold, no pets or smokers. She does not take flu shot. Told that she snores when nose is congested. Some associated sore throat and headaches. CXR 04/06/11-reviewed IMPRESSION:  No active disease. Improvement in aeration. No new infiltrate.  Original Report Authenticated By: Lahoma Crocker, M.D.   02/16/12- 8 yoF never smoker seeking allergy evaluation at the suggestion of her ophthalmologist Dr. Katy Apo FOLLOWS FOR: eyes are still itchy and watery; had a flare up near Christmas and was given Pred taper which helped. Review in detail allergy labs with patient. Still using Dr Marthe Patch eyedrops. Little wheezing. Has continued to [place]. Ran out of Dymista nasal spray sample which didn't seem to make a lot of difference. Continues Patanol. Allergy Profile 12/29/2011-total IgE 250.8 with specific elevations for dust mite/house dust,  dog dander, pollens and a few incidentals.  03/20/12- 36 yoF never smoker seeking allergy evaluation at the suggestion of her ophthalmologist Dr. Katy Apo took Allegra about 2 days ago,No OTC sleep aids or cough syrups. Denies pregnant. Good winter- no flu, feels well execpt eyes water and itch.  Allergy skin test 03/20/12-  significant reactions especially for grass pollens, weed pollens, dust mite.  08/01/13 29 yoF never smoker followed for allergic conjunctivitis, allergic conjunctivitis ophthalmologist Dr. Katy Apo FOLLOWS FOR:  Allergies improved since last OV-- still has slight PND and itchy eyes Taking Singulair and 2 Allegra.  We reviewed her skin test results again. She feels much better controlled using 2 Allegra tablets plus Singulair. Medication talk done. This was a good spring.  06/15/14- 39 yoF never smoker followed for allergic conjunctivitis, allergic conjunctivitis/ ophthalmologist Dr. Katy Apo Follows for: feels regimen is working well; little PND; no concerns at this time We reviewed medications and discussed the spring pollen season  ROS-see HPI Constitutional:   No-   weight loss, night sweats, fevers, chills, fatigue, lassitude. HEENT:   No-  headaches, difficulty swallowing, tooth/dental problems, sore throat,       +  sneezing, itching, ear ache, nasal congestion, post nasal drip,  CV:  No-   chest pain, orthopnea, PND, swelling in lower extremities, anasarca, dizziness, palpitations Resp: No-   shortness of breath with exertion or at rest.              No-   productive cough,  No non-productive cough,  No- coughing up of blood.              No-   change in color of mucus.  No- wheezing.   Skin: No-   rash or lesions.  OBJ- Physical Exam General- Alert, Oriented, Affect-appropriate, Distress- none acute Skin- rash-none, lesions- none, excoriation- none Lymphadenopathy- none Head- atraumatic            Eyes- Gross vision intact, PERRLA, minimal conjunctival injection,  Ears- Hearing, canals-normal            Nose- + turbinate edema, , no-Septal dev, mucus, polyps, erosion, perforation             Throat- Mallampati III , mucosa-clear , drainage- none, tonsils- atrophic Neck- flexible , trachea midline, no stridor , thyroid nl, carotid no bruit Chest - symmetrical  excursion , unlabored           Heart/CV- RRR , no murmur , no gallop  , no rub, nl s1 s2                           - JVD- none , edema- none, stasis changes- none, varices- none           Lung- clear to P&A, wheeze- none, cough- none , dullness-none, rub- none           Chest wall-  Abd-  Br/ Gen/ Rectal- Not done, not indicated Extrem- cyanosis- none, clubbing, none, atrophy- none, strength- nl Neuro- grossly intact to observation

## 2014-06-28 NOTE — Assessment & Plan Note (Signed)
Allergic rhinitis and conjunctivitis, much better controlled this spring. We discussed tapering off medications as tolerated. Plan-okay to try off medications, restarting as needed, as discussed.

## 2014-08-04 ENCOUNTER — Ambulatory Visit: Payer: 59 | Admitting: Internal Medicine

## 2014-08-05 ENCOUNTER — Other Ambulatory Visit: Payer: Self-pay | Admitting: Internal Medicine

## 2014-08-19 ENCOUNTER — Other Ambulatory Visit: Payer: Self-pay | Admitting: Internal Medicine

## 2015-01-16 ENCOUNTER — Other Ambulatory Visit: Payer: Self-pay | Admitting: Internal Medicine

## 2015-02-04 ENCOUNTER — Other Ambulatory Visit: Payer: Self-pay | Admitting: Internal Medicine

## 2015-04-16 ENCOUNTER — Other Ambulatory Visit: Payer: Self-pay | Admitting: Internal Medicine

## 2015-04-16 NOTE — Telephone Encounter (Signed)
i am showing dr Ronnald Ramp as patient's pcp---but patient hasn't seen dr Ronnald Ramp since 2014---was seeing dr hopper----routing to dr Ronnald Ramp, do you need OV for this refill on zantac---please advise, thanks

## 2015-06-09 LAB — HM MAMMOGRAPHY: HM MAMMO: NORMAL (ref 0–4)

## 2015-06-09 LAB — HM PAP SMEAR

## 2015-06-15 ENCOUNTER — Ambulatory Visit: Payer: 59 | Admitting: Internal Medicine

## 2015-06-17 ENCOUNTER — Ambulatory Visit (INDEPENDENT_AMBULATORY_CARE_PROVIDER_SITE_OTHER): Payer: 59 | Admitting: Internal Medicine

## 2015-06-17 ENCOUNTER — Encounter: Payer: Self-pay | Admitting: Internal Medicine

## 2015-06-17 VITALS — BP 98/62 | HR 62 | Ht 61.25 in | Wt 123.4 lb

## 2015-06-17 DIAGNOSIS — H1013 Acute atopic conjunctivitis, bilateral: Secondary | ICD-10-CM

## 2015-06-17 DIAGNOSIS — J309 Allergic rhinitis, unspecified: Principal | ICD-10-CM

## 2015-06-17 MED ORDER — OLOPATADINE HCL 0.1 % OP SOLN: 1.0000 [drp] | Freq: Two times a day (BID) | OPHTHALMIC | Status: DC

## 2015-06-17 NOTE — Patient Instructions (Signed)
We can continue current meds. Suggest you try backing off allegra as you find you are able, to reduce drying.  Patanol refill sent  Ok to try a little vaseline around your eyes if needed for irritation  Please call if we can help

## 2015-06-17 NOTE — Assessment & Plan Note (Signed)
Adequate symptomatic control but not using a lot of antihistamine. We talked about ways to manage dryness and suggested she back off of Allegra when she can, at the end of the spring season. She'll use a little Vaseline on irritated periorbital skin if needed

## 2015-06-17 NOTE — Progress Notes (Signed)
12/29/11 57 yoF never smoker seeking allergy evaluation at the suggestion of her ophthalmologist Dr. Katy Apo She was diagnosed with allergic rhinitis in childhood and has had seasonal problems since then. Usually this is felt in spring and fall as burning and itching of eyes, sneezing. She has not had asthma or significant problems with food or skin rashes. Skin testing as a child without allergy vaccine. No ENT surgery. She is being treated for obstructed lacrimal ducts and allergic conjunctivitis using eyedrops, rarely including a steroid eye drop. Primary recognized trigger has been seasonal pollens. Last prednisone April 2013. Married living with husband and 53-year-old daughter, working in an office environment. House has basement, no recognized mold, no pets or smokers. She does not take flu shot. Told that she snores when nose is congested. Some associated sore throat and headaches. CXR 04/06/11-reviewed IMPRESSION:  No active disease. Improvement in aeration. No new infiltrate.  Original Report Authenticated By: Lahoma Crocker, M.D.   02/16/12- 49 yoF never smoker seeking allergy evaluation at the suggestion of her ophthalmologist Dr. Katy Apo FOLLOWS FOR: eyes are still itchy and watery; had a flare up near Christmas and was given Pred taper which helped. Review in detail allergy labs with patient. Still using Dr Marthe Patch eyedrops. Little wheezing. Has continued to [place]. Ran out of Dymista nasal spray sample which didn't seem to make a lot of difference. Continues Patanol. Allergy Profile 12/29/2011-total IgE 250.8 with specific elevations for dust mite/house dust,  dog dander, pollens and a few incidentals.  03/20/12- 36 yoF never smoker seeking allergy evaluation at the suggestion of her ophthalmologist Dr. Katy Apo took Allegra about 2 days ago,No OTC sleep aids or cough syrups. Denies pregnant. Good winter- no flu, feels well execpt eyes water and itch.  Allergy skin test 03/20/12-  significant reactions especially for grass pollens, weed pollens, dust mite.  08/01/13 1 yoF never smoker followed for allergic conjunctivitis, allergic conjunctivitis ophthalmologist Dr. Katy Apo FOLLOWS FOR:  Allergies improved since last OV-- still has slight PND and itchy eyes Taking Singulair and 2 Allegra.  We reviewed her skin test results again. She feels much better controlled using 2 Allegra tablets plus Singulair. Medication talk done. This was a good spring.  06/15/14- 39 yoF never smoker followed for allergic conjunctivitis, allergic conjunctivitis/ ophthalmologist Dr. Katy Apo Follows for: feels regimen is working well; little PND; no concerns at this time We reviewed medications and discussed the spring pollen season  06/17/2015-40 year old female never smoker followed for allergic conjunctivitis, allergic rhinitis FOLLOW FOR:  Allergies doing well.  Eyes have been dry, itchy.  discuss what to put around eyes when they get irritated. She reports doing much better than in past years. Current regimen is daily Allegra 180 mg 2, Patanol eyedrops if needed, azelastine nasal spray if needed.  ROS-see HPI Constitutional:   No-   weight loss, night sweats, fevers, chills, fatigue, lassitude. HEENT:   No-  headaches, difficulty swallowing, tooth/dental problems, sore throat,       +  sneezing, itching, ear ache, nasal congestion, post nasal drip,  CV:  No-   chest pain, orthopnea, PND, swelling in lower extremities, anasarca, dizziness, palpitations Resp: No-   shortness of breath with exertion or at rest.              No-   productive cough,  No non-productive cough,  No- coughing up of blood.              No-  change in color of mucus.  No- wheezing.   Skin: No-   rash or lesions.  OBJ- Physical Exam General- Alert, Oriented, Affect-appropriate, Distress- none acute Skin- rash-none, lesions- none, excoriation- none Lymphadenopathy- none Head- atraumatic            Eyes-  Gross vision intact, PERRLA, no-conjunctival injection,             Ears- Hearing, canals-normal            Nose- + turbinate edema, , no-Septal dev, mucus, polyps, erosion, perforation             Throat- Mallampati III , mucosa-clear , drainage- none, tonsils- atrophic Neck- flexible , trachea midline, no stridor , thyroid nl, carotid no bruit Chest - symmetrical excursion , unlabored           Heart/CV- RRR , no murmur , no gallop  , no rub, nl s1 s2                           - JVD- none , edema- none, stasis changes- none, varices- none           Lung- clear to P&A, wheeze- none, cough- none , dullness-none, rub- none           Chest wall-  Abd-  Br/ Gen/ Rectal- Not done, not indicated Extrem- cyanosis- none, clubbing, none, atrophy- none, strength- nl Neuro- grossly intact to observation

## 2015-06-28 ENCOUNTER — Other Ambulatory Visit: Payer: Self-pay | Admitting: Internal Medicine

## 2015-07-26 ENCOUNTER — Other Ambulatory Visit: Payer: Self-pay | Admitting: Internal Medicine

## 2015-09-13 ENCOUNTER — Encounter: Payer: Self-pay | Admitting: Internal Medicine

## 2015-09-13 ENCOUNTER — Other Ambulatory Visit (INDEPENDENT_AMBULATORY_CARE_PROVIDER_SITE_OTHER): Payer: 59

## 2015-09-13 ENCOUNTER — Ambulatory Visit (INDEPENDENT_AMBULATORY_CARE_PROVIDER_SITE_OTHER): Payer: 59 | Admitting: Internal Medicine

## 2015-09-13 VITALS — BP 116/68 | HR 68 | Temp 98.2°F | Resp 16 | Wt 122.0 lb

## 2015-09-13 DIAGNOSIS — Z0000 Encounter for general adult medical examination without abnormal findings: Secondary | ICD-10-CM

## 2015-09-13 DIAGNOSIS — Z23 Encounter for immunization: Secondary | ICD-10-CM

## 2015-09-13 DIAGNOSIS — Z299 Encounter for prophylactic measures, unspecified: Secondary | ICD-10-CM

## 2015-09-13 LAB — CBC WITH DIFFERENTIAL/PLATELET
BASOS PCT: 0.4 % (ref 0.0–3.0)
Basophils Absolute: 0 10*3/uL (ref 0.0–0.1)
EOS ABS: 0.3 10*3/uL (ref 0.0–0.7)
EOS PCT: 2.4 % (ref 0.0–5.0)
HEMATOCRIT: 43.2 % (ref 36.0–46.0)
HEMOGLOBIN: 14.9 g/dL (ref 12.0–15.0)
LYMPHS PCT: 23.2 % (ref 12.0–46.0)
Lymphs Abs: 2.8 10*3/uL (ref 0.7–4.0)
MCHC: 34.4 g/dL (ref 30.0–36.0)
MCV: 89.1 fl (ref 78.0–100.0)
MONOS PCT: 10.9 % (ref 3.0–12.0)
Monocytes Absolute: 1.3 10*3/uL — ABNORMAL HIGH (ref 0.1–1.0)
NEUTROS ABS: 7.5 10*3/uL (ref 1.4–7.7)
Neutrophils Relative %: 63.1 % (ref 43.0–77.0)
PLATELETS: 254 10*3/uL (ref 150.0–400.0)
RBC: 4.85 Mil/uL (ref 3.87–5.11)
RDW: 12.3 % (ref 11.5–15.5)
WBC: 11.9 10*3/uL — AB (ref 4.0–10.5)

## 2015-09-13 LAB — LIPID PANEL
CHOLESTEROL: 195 mg/dL (ref 0–200)
HDL: 88.4 mg/dL (ref >39.00)
LDL Cholesterol: 85 mg/dL (ref 0–99)
NonHDL: 106.14
TRIGLYCERIDES: 107 mg/dL (ref 0.0–149.0)
Total CHOL/HDL Ratio: 2
VLDL: 21.4 mg/dL (ref 0.0–40.0)

## 2015-09-13 LAB — COMPREHENSIVE METABOLIC PANEL
ALBUMIN: 4.1 g/dL (ref 3.5–5.2)
ALT: 11 U/L (ref 0–35)
AST: 15 U/L (ref 0–37)
Alkaline Phosphatase: 70 U/L (ref 39–117)
BUN: 14 mg/dL (ref 6–23)
CALCIUM: 9.5 mg/dL (ref 8.4–10.5)
CHLORIDE: 101 meq/L (ref 96–112)
CO2: 29 mEq/L (ref 19–32)
CREATININE: 0.83 mg/dL (ref 0.40–1.20)
GFR: 79.98 mL/min (ref >60.00)
Glucose, Bld: 83 mg/dL (ref 70–99)
Potassium: 4.6 mEq/L (ref 3.5–5.1)
Sodium: 137 mEq/L (ref 135–145)
Total Bilirubin: 0.6 mg/dL (ref 0.2–1.2)
Total Protein: 7.5 g/dL (ref 6.0–8.3)

## 2015-09-13 LAB — HIV ANTIBODY (ROUTINE TESTING W REFLEX): HIV: NONREACTIVE

## 2015-09-13 LAB — TSH: TSH: 1.23 u[IU]/mL (ref 0.35–4.50)

## 2015-09-13 NOTE — Progress Notes (Signed)
Pre visit review using our clinic review tool, if applicable. No additional management support is needed unless otherwise documented below in the visit note. 

## 2015-09-13 NOTE — Progress Notes (Signed)
Subjective:  Patient ID: Briana Sanchez, female    DOB: November 15, 1975  Age: 40 y.o. MRN: [id number]  CC: Annual Exam   HPI Briana Sanchez presents for a CPX.  She feels well and offers no complaints.  Outpatient Medications Prior to Visit  Medication Sig Dispense Refill  . azelastine (ASTELIN) 137 MCG/SPRAY nasal spray 1-2 sprays each nostril once or twice daily as needed 30 mL 12  . fexofenadine (ALLEGRA) 180 MG tablet Take 1 tablet (180 mg total) by mouth daily.    . Multiple Vitamin (MULTIVITAMIN) tablet Take 1 tablet by mouth daily.    Donnetta Hail Estradiol (ORTHO-CYCLEN, 28, PO) Take by mouth.      Marland Kitchen olopatadine (PATANOL) 0.1 % ophthalmic solution Place 1 drop into both eyes 2 (two) times daily. 5 mL 12  . ranitidine (ZANTAC) 150 MG tablet TAKE 1 TABLET (150 MG TOTAL) BY MOUTH 2 (TWO) TIMES DAILY. 60 tablet 0   No facility-administered medications prior to visit.     ROS Review of Systems  Constitutional: Negative.  Negative for chills and fatigue.  HENT: Negative.  Negative for sinus pressure, trouble swallowing and voice change.   Eyes: Negative.  Negative for visual disturbance.  Respiratory: Negative for cough, choking, chest tightness and shortness of breath.   Cardiovascular: Negative.  Negative for chest pain, palpitations and leg swelling.  Gastrointestinal: Negative.  Negative for abdominal pain, diarrhea, nausea and vomiting.  Endocrine: Negative.   Genitourinary: Negative.  Negative for difficulty urinating.  Musculoskeletal: Negative.  Negative for arthralgias, myalgias and neck pain.  Skin: Negative.  Negative for color change, pallor and rash.  Allergic/Immunologic: Negative.   Neurological: Negative.   Hematological: Negative.  Negative for adenopathy. Does not bruise/bleed easily.  Psychiatric/Behavioral: Negative.     Objective:  BP 116/68   Pulse 68   Temp 98.2 F (36.8 C) (Oral)   Resp 16   Wt 122 lb (55.3 kg)   LMP 08/27/2015    SpO2 97%   BMI 22.86 kg/m   BP Readings from Last 3 Encounters:  09/13/15 116/68  06/17/15 98/62  06/15/14 90/68    Wt Readings from Last 3 Encounters:  09/13/15 122 lb (55.3 kg)  06/17/15 123 lb 6.4 oz (56 kg)  06/15/14 124 lb (56.2 kg)    Physical Exam  Constitutional: She is oriented to person, place, and time. She appears well-developed and well-nourished.  HENT:  Head: Normocephalic and atraumatic.  Mouth/Throat: Oropharynx is clear and moist. No oropharyngeal exudate.  Eyes: Conjunctivae are normal. Right eye exhibits no discharge. Left eye exhibits no discharge. No scleral icterus.  Neck: Normal range of motion. Neck supple. No JVD present. No tracheal deviation present. No thyromegaly present.  Cardiovascular: Normal rate, regular rhythm, normal heart sounds and intact distal pulses.  Exam reveals no friction rub.   No murmur heard. Pulmonary/Chest: Effort normal and breath sounds normal. No stridor. No respiratory distress. She has no wheezes. She has no rales. She exhibits no tenderness.  Abdominal: Soft. Bowel sounds are normal. She exhibits no distension and no mass. There is no tenderness. There is no rebound and no guarding.  Musculoskeletal: Normal range of motion. She exhibits no edema, tenderness or deformity.  Lymphadenopathy:    She has no cervical adenopathy.  Neurological: She is oriented to person, place, and time.  Skin: Skin is warm and dry. No rash noted. She is not diaphoretic. No erythema. No pallor.  Vitals reviewed.   Lab Results  Component  Value Date   WBC 11.9 (H) 09/13/2015   HGB 14.9 09/13/2015   HCT 43.2 09/13/2015   PLT 254.0 09/13/2015   GLUCOSE 83 09/13/2015   CHOL 195 09/13/2015   TRIG 107.0 09/13/2015   HDL 88.40 09/13/2015   LDLCALC 85 09/13/2015   ALT 11 09/13/2015   AST 15 09/13/2015   NA 137 09/13/2015   K 4.6 09/13/2015   CL 101 09/13/2015   CREATININE 0.83 09/13/2015   BUN 14 09/13/2015   CO2 29 09/13/2015   TSH 1.23  09/13/2015    Dg Chest 2 View  Result Date: 04/06/2011 *RADIOLOGY REPORT* Clinical Data: Follow up pneumonia CHEST - 2 VIEW Comparison: 03/22/2011. Findings: Cardiomediastinal silhouette is stable.  No acute infiltrate or pulmonary edema.  The previous infiltrate in the lingula has cleared.  No pleural effusion.  Bony thorax is stable. IMPRESSION: No active disease.  Improvement in aeration.  No new infiltrate. Original Report Authenticated By: Lahoma Crocker, M.D.   Assessment & Plan:   Huxley was seen today for annual exam.  Diagnoses and all orders for this visit:  Routine general medical examination at a health care facility- exam completed, labs ordered and reviewed-she has a very mildly elevated white blood cell count but she has had this for nearly 7 years, all of her other cell lines are normal so I think this is a benign entity, her Pap and mammogram are up-to-date, vaccines reviewed and updated, patient education material was given. -     Lipid panel; Future -     CBC with Differential/Platelet; Future -     Comprehensive metabolic panel; Future -     TSH; Future -     HIV antibody; Future  Need for prophylactic measure -     Tdap vaccine greater than or equal to 7yo IM   I am having Ms. Torry maintain her ([employer]orgestimate-Eth Estradiol (ORTHO-CYCLEN, 28, PO)), fexofenadine, multivitamin, azelastine, ranitidine, and olopatadine.  No orders of the defined types were placed in this encounter.    Follow-up: Return if symptoms worsen or fail to improve.  Scarlette Calico, MD

## 2015-09-13 NOTE — Patient Instructions (Signed)
Preventive Care for Adults, Female A healthy lifestyle and preventive care can promote health and wellness. Preventive health guidelines for women include the following key practices.  A routine yearly physical is a good way to check with your health care provider about your health and preventive screening. It is a chance to share any concerns and updates on your health and to receive a thorough exam.  Visit your dentist for a routine exam and preventive care every 6 months. Brush your teeth twice a day and floss once a day. Good oral hygiene prevents tooth decay and gum disease.  The frequency of eye exams is based on your age, health, family medical history, use of contact lenses, and other factors. Follow your health care provider's recommendations for frequency of eye exams.  Eat a healthy diet. Foods like vegetables, fruits, whole grains, low-fat dairy products, and lean protein foods contain the nutrients you need without too many calories. Decrease your intake of foods high in solid fats, added sugars, and salt. Eat the right amount of calories for you.Get information about a proper diet from your health care provider, if necessary.  Regular physical exercise is one of the most important things you can do for your health. Most adults should get at least 150 minutes of moderate-intensity exercise (any activity that increases your heart rate and causes you to sweat) each week. In addition, most adults need muscle-strengthening exercises on 2 or more days a week.  Maintain a healthy weight. The body mass index (BMI) is a screening tool to identify possible weight problems. It provides an estimate of body fat based on height and weight. Your health care provider can find your BMI and can help you achieve or maintain a healthy weight.For adults 20 years and older:  A BMI below 18.5 is considered underweight.  A BMI of 18.5 to 24.9 is normal.  A BMI of 25 to 29.9 is considered overweight.  A  BMI of 30 and above is considered obese.  Maintain normal blood lipids and cholesterol levels by exercising and minimizing your intake of saturated fat. Eat a balanced diet with plenty of fruit and vegetables. Blood tests for lipids and cholesterol should begin at age 45 and be repeated every 5 years. If your lipid or cholesterol levels are high, you are over 50, or you are at high risk for heart disease, you may need your cholesterol levels checked more frequently.Ongoing high lipid and cholesterol levels should be treated with medicines if diet and exercise are not working.  If you smoke, find out from your health care provider how to quit. If you do not use tobacco, do not start.  Lung cancer screening is recommended for adults aged 45-80 years who are at high risk for developing lung cancer because of a history of smoking. A yearly low-dose CT scan of the lungs is recommended for people who have at least a 30-pack-year history of smoking and are a current smoker or have quit within the past 15 years. A pack year of smoking is smoking an average of 1 pack of cigarettes a day for 1 year (for example: 1 pack a day for 30 years or 2 packs a day for 15 years). Yearly screening should continue until the smoker has stopped smoking for at least 15 years. Yearly screening should be stopped for people who develop a health problem that would prevent them from having lung cancer treatment.  If you are pregnant, do not drink alcohol. If you are  breastfeeding, be very cautious about drinking alcohol. If you are not pregnant and choose to drink alcohol, do not have more than 1 drink per day. One drink is considered to be 12 ounces (355 mL) of beer, 5 ounces (148 mL) of wine, or 1.5 ounces (44 mL) of liquor.  Avoid use of street drugs. Do not share needles with anyone. Ask for help if you need support or instructions about stopping the use of drugs.  High blood pressure causes heart disease and increases the risk  of stroke. Your blood pressure should be checked at least every 1 to 2 years. Ongoing high blood pressure should be treated with medicines if weight loss and exercise do not work.  If you are 55-79 years old, ask your health care provider if you should take aspirin to prevent strokes.  Diabetes screening is done by taking a blood sample to check your blood glucose level after you have not eaten for a certain period of time (fasting). If you are not overweight and you do not have risk factors for diabetes, you should be screened once every 3 years starting at age 45. If you are overweight or obese and you are 40-70 years of age, you should be screened for diabetes every year as part of your cardiovascular risk assessment.  Breast cancer screening is essential preventive care for women. You should practice "breast self-awareness." This means understanding the normal appearance and feel of your breasts and may include breast self-examination. Any changes detected, no matter how small, should be reported to a health care provider. Women in their 20s and 30s should have a clinical breast exam (CBE) by a health care provider as part of a regular health exam every 1 to 3 years. After age 40, women should have a CBE every year. Starting at age 40, women should consider having a mammogram (breast X-ray test) every year. Women who have a family history of breast cancer should talk to their health care provider about genetic screening. Women at a high risk of breast cancer should talk to their health care providers about having an MRI and a mammogram every year.  Breast cancer gene (BRCA)-related cancer risk assessment is recommended for women who have family members with BRCA-related cancers. BRCA-related cancers include breast, ovarian, tubal, and peritoneal cancers. Having family members with these cancers may be associated with an increased risk for harmful changes (mutations) in the breast cancer genes BRCA1 and  BRCA2. Results of the assessment will determine the need for genetic counseling and BRCA1 and BRCA2 testing.  Your health care provider may recommend that you be screened regularly for cancer of the pelvic organs (ovaries, uterus, and vagina). This screening involves a pelvic examination, including checking for microscopic changes to the surface of your cervix (Pap test). You may be encouraged to have this screening done every 3 years, beginning at age 21.  For women ages 30-65, health care providers may recommend pelvic exams and Pap testing every 3 years, or they may recommend the Pap and pelvic exam, combined with testing for human papilloma virus (HPV), every 5 years. Some types of HPV increase your risk of cervical cancer. Testing for HPV may also be done on women of any age with unclear Pap test results.  Other health care providers may not recommend any screening for nonpregnant women who are considered low risk for pelvic cancer and who do not have symptoms. Ask your health care provider if a screening pelvic exam is right for   you.  If you have had past treatment for cervical cancer or a condition that could lead to cancer, you need Pap tests and screening for cancer for at least 20 years after your treatment. If Pap tests have been discontinued, your risk factors (such as having a new sexual partner) need to be reassessed to determine if screening should resume. Some women have medical problems that increase the chance of getting cervical cancer. In these cases, your health care provider may recommend more frequent screening and Pap tests.  Colorectal cancer can be detected and often prevented. Most routine colorectal cancer screening begins at the age of 50 years and continues through age 75 years. However, your health care provider may recommend screening at an earlier age if you have risk factors for colon cancer. On a yearly basis, your health care provider may provide home test kits to check  for hidden blood in the stool. Use of a small camera at the end of a tube, to directly examine the colon (sigmoidoscopy or colonoscopy), can detect the earliest forms of colorectal cancer. Talk to your health care provider about this at age 50, when routine screening begins. Direct exam of the colon should be repeated every 5-10 years through age 75 years, unless early forms of precancerous polyps or small growths are found.  People who are at an increased risk for hepatitis B should be screened for this virus. You are considered at high risk for hepatitis B if:  You were born in a country where hepatitis B occurs often. Talk with your health care provider about which countries are considered high risk.  Your parents were born in a high-risk country and you have not received a shot to protect against hepatitis B (hepatitis B vaccine).  You have HIV or AIDS.  You use needles to inject street drugs.  You live with, or have sex with, someone who has hepatitis B.  You get hemodialysis treatment.  You take certain medicines for conditions like cancer, organ transplantation, and autoimmune conditions.  Hepatitis C blood testing is recommended for all people born from 1945 through 1965 and any individual with known risks for hepatitis C.  Practice safe sex. Use condoms and avoid high-risk sexual practices to reduce the spread of sexually transmitted infections (STIs). STIs include gonorrhea, chlamydia, syphilis, trichomonas, herpes, HPV, and human immunodeficiency virus (HIV). Herpes, HIV, and HPV are viral illnesses that have no cure. They can result in disability, cancer, and death.  You should be screened for sexually transmitted illnesses (STIs) including gonorrhea and chlamydia if:  You are sexually active and are younger than 24 years.  You are older than 24 years and your health care provider tells you that you are at risk for this type of infection.  Your sexual activity has changed  since you were last screened and you are at an increased risk for chlamydia or gonorrhea. Ask your health care provider if you are at risk.  If you are at risk of being infected with HIV, it is recommended that you take a prescription medicine daily to prevent HIV infection. This is called preexposure prophylaxis (PrEP). You are considered at risk if:  You are sexually active and do not regularly use condoms or know the HIV status of your partner(s).  You take drugs by injection.  You are sexually active with a partner who has HIV.  Talk with your health care provider about whether you are at high risk of being infected with HIV. If   you choose to begin PrEP, you should first be tested for HIV. You should then be tested every 3 months for as long as you are taking PrEP.  Osteoporosis is a disease in which the bones lose minerals and strength with aging. This can result in serious bone fractures or breaks. The risk of osteoporosis can be identified using a bone density scan. Women ages 67 years and over and women at risk for fractures or osteoporosis should discuss screening with their health care providers. Ask your health care provider whether you should take a calcium supplement or vitamin D to reduce the rate of osteoporosis.  Menopause can be associated with physical symptoms and risks. Hormone replacement therapy is available to decrease symptoms and risks. You should talk to your health care provider about whether hormone replacement therapy is right for you.  Use sunscreen. Apply sunscreen liberally and repeatedly throughout the day. You should seek shade when your shadow is shorter than you. Protect yourself by wearing long sleeves, pants, a wide-brimmed hat, and sunglasses year round, whenever you are outdoors.  Once a month, do a whole body skin exam, using a mirror to look at the skin on your back. Tell your health care provider of new moles, moles that have irregular borders, moles that  are larger than a pencil eraser, or moles that have changed in shape or color.  Stay current with required vaccines (immunizations).  Influenza vaccine. All adults should be immunized every year.  Tetanus, diphtheria, and acellular pertussis (Td, Tdap) vaccine. Pregnant women should receive 1 dose of Tdap vaccine during each pregnancy. The dose should be obtained regardless of the length of time since the last dose. Immunization is preferred during the 27th-36th week of gestation. An adult who has not previously received Tdap or who does not know her vaccine status should receive 1 dose of Tdap. This initial dose should be followed by tetanus and diphtheria toxoids (Td) booster doses every 10 years. Adults with an unknown or incomplete history of completing a 3-dose immunization series with Td-containing vaccines should begin or complete a primary immunization series including a Tdap dose. Adults should receive a Td booster every 10 years.  Varicella vaccine. An adult without evidence of immunity to varicella should receive 2 doses or a second dose if she has previously received 1 dose. Pregnant females who do not have evidence of immunity should receive the first dose after pregnancy. This first dose should be obtained before leaving the health care facility. The second dose should be obtained 4-8 weeks after the first dose.  Human papillomavirus (HPV) vaccine. Females aged 13-26 years who have not received the vaccine previously should obtain the 3-dose series. The vaccine is not recommended for use in pregnant females. However, pregnancy testing is not needed before receiving a dose. If a female is found to be pregnant after receiving a dose, no treatment is needed. In that case, the remaining doses should be delayed until after the pregnancy. Immunization is recommended for any person with an immunocompromised condition through the age of 61 years if she did not get any or all doses earlier. During the  3-dose series, the second dose should be obtained 4-8 weeks after the first dose. The third dose should be obtained 24 weeks after the first dose and 16 weeks after the second dose.  Zoster vaccine. One dose is recommended for adults aged 30 years or older unless certain conditions are present.  Measles, mumps, and rubella (MMR) vaccine. Adults born  before 1957 generally are considered immune to measles and mumps. Adults born in 1957 or later should have 1 or more doses of MMR vaccine unless there is a contraindication to the vaccine or there is laboratory evidence of immunity to each of the three diseases. A routine second dose of MMR vaccine should be obtained at least 28 days after the first dose for students attending postsecondary schools, health care workers, or international travelers. People who received inactivated measles vaccine or an unknown type of measles vaccine during 1963-1967 should receive 2 doses of MMR vaccine. People who received inactivated mumps vaccine or an unknown type of mumps vaccine before 1979 and are at high risk for mumps infection should consider immunization with 2 doses of MMR vaccine. For females of childbearing age, rubella immunity should be determined. If there is no evidence of immunity, females who are not pregnant should be vaccinated. If there is no evidence of immunity, females who are pregnant should delay immunization until after pregnancy. Unvaccinated health care workers born before 1957 who lack laboratory evidence of measles, mumps, or rubella immunity or laboratory confirmation of disease should consider measles and mumps immunization with 2 doses of MMR vaccine or rubella immunization with 1 dose of MMR vaccine.  Pneumococcal 13-valent conjugate (PCV13) vaccine. When indicated, a person who is uncertain of his immunization history and has no record of immunization should receive the PCV13 vaccine. All adults 65 years of age and older should receive this  vaccine. An adult aged 19 years or older who has certain medical conditions and has not been previously immunized should receive 1 dose of PCV13 vaccine. This PCV13 should be followed with a dose of pneumococcal polysaccharide (PPSV23) vaccine. Adults who are at high risk for pneumococcal disease should obtain the PPSV23 vaccine at least 8 weeks after the dose of PCV13 vaccine. Adults older than 42 years of age who have normal immune system function should obtain the PPSV23 vaccine dose at least 1 year after the dose of PCV13 vaccine.  Pneumococcal polysaccharide (PPSV23) vaccine. When PCV13 is also indicated, PCV13 should be obtained first. All adults aged 65 years and older should be immunized. An adult younger than age 65 years who has certain medical conditions should be immunized. Any person who resides in a nursing home or long-term care facility should be immunized. An adult smoker should be immunized. People with an immunocompromised condition and certain other conditions should receive both PCV13 and PPSV23 vaccines. People with human immunodeficiency virus (HIV) infection should be immunized as soon as possible after diagnosis. Immunization during chemotherapy or radiation therapy should be avoided. Routine use of PPSV23 vaccine is not recommended for American Indians, [place] Natives, or people younger than 65 years unless there are medical conditions that require PPSV23 vaccine. When indicated, people who have unknown immunization and have no record of immunization should receive PPSV23 vaccine. One-time revaccination 5 years after the first dose of PPSV23 is recommended for people aged 19-64 years who have chronic kidney failure, nephrotic syndrome, asplenia, or immunocompromised conditions. People who received 1-2 doses of PPSV23 before age 65 years should receive another dose of PPSV23 vaccine at age 65 years or later if at least 5 years have passed since the previous dose. Doses of PPSV23 are not  needed for people immunized with PPSV23 at or after age 65 years.  Meningococcal vaccine. Adults with asplenia or persistent complement component deficiencies should receive 2 doses of quadrivalent meningococcal conjugate (MenACWY-D) vaccine. The doses should be obtained   at least 2 months apart. Microbiologists working with certain meningococcal bacteria, [place] recruits, people at risk during an outbreak, and people who travel to or live in countries with a high rate of meningitis should be immunized. A first-year college student up through age 34 years who is living in a residence hall should receive a dose if she did not receive a dose on or after her 16th birthday. Adults who have certain high-risk conditions should receive one or more doses of vaccine.  Hepatitis A vaccine. Adults who wish to be protected from this disease, have certain high-risk conditions, work with hepatitis A-infected animals, work in hepatitis A research labs, or travel to or work in countries with a high rate of hepatitis A should be immunized. Adults who were previously unvaccinated and who anticipate close contact with an international adoptee during the first 60 days after arrival in the [place] from a country with a high rate of hepatitis A should be immunized.  Hepatitis B vaccine. Adults who wish to be protected from this disease, have certain high-risk conditions, may be exposed to blood or other infectious body fluids, are household contacts or sex partners of hepatitis B positive people, are clients or workers in certain care facilities, or travel to or work in countries with a high rate of hepatitis B should be immunized.  Haemophilus influenzae type b (Hib) vaccine. A previously unvaccinated person with asplenia or sickle cell disease or having a scheduled splenectomy should receive 1 dose of Hib vaccine. Regardless of previous immunization, a recipient of a hematopoietic stem cell transplant should receive a  3-dose series 6-12 months after her successful transplant. Hib vaccine is not recommended for adults with HIV infection. Preventive Services / Frequency Ages 35 to 4 years  Blood pressure check.** / Every 3-5 years.  Lipid and cholesterol check.** / Every 5 years beginning at age 60.  Clinical breast exam.** / Every 3 years for women in their 71s and 10s.  BRCA-related cancer risk assessment.** / For women who have family members with a BRCA-related cancer (breast, ovarian, tubal, or peritoneal cancers).  Pap test.** / Every 2 years from ages 76 through 26. Every 3 years starting at age 61 through age 76 or 93 with a history of 3 consecutive normal Pap tests.  HPV screening.** / Every 3 years from ages 37 through ages 60 to 51 with a history of 3 consecutive normal Pap tests.  Hepatitis C blood test.** / For any individual with known risks for hepatitis C.  Skin self-exam. / Monthly.  Influenza vaccine. / Every year.  Tetanus, diphtheria, and acellular pertussis (Tdap, Td) vaccine.** / Consult your health care provider. Pregnant women should receive 1 dose of Tdap vaccine during each pregnancy. 1 dose of Td every 10 years.  Varicella vaccine.** / Consult your health care provider. Pregnant females who do not have evidence of immunity should receive the first dose after pregnancy.  HPV vaccine. / 3 doses over 6 months, if 93 and younger. The vaccine is not recommended for use in pregnant females. However, pregnancy testing is not needed before receiving a dose.  Measles, mumps, rubella (MMR) vaccine.** / You need at least 1 dose of MMR if you were born in 1957 or later. You may also need a 2nd dose. For females of childbearing age, rubella immunity should be determined. If there is no evidence of immunity, females who are not pregnant should be vaccinated. If there is no evidence of immunity, females who are  pregnant should delay immunization until after pregnancy.  Pneumococcal  13-valent conjugate (PCV13) vaccine.** / Consult your health care provider.  Pneumococcal polysaccharide (PPSV23) vaccine.** / 1 to 2 doses if you smoke cigarettes or if you have certain conditions.  Meningococcal vaccine.** / 1 dose if you are age 68 to 8 years and a Market researcher living in a residence hall, or have one of several medical conditions, you need to get vaccinated against meningococcal disease. You may also need additional booster doses.  Hepatitis A vaccine.** / Consult your health care provider.  Hepatitis B vaccine.** / Consult your health care provider.  Haemophilus influenzae type b (Hib) vaccine.** / Consult your health care provider. Ages 7 to 53 years  Blood pressure check.** / Every year.  Lipid and cholesterol check.** / Every 5 years beginning at age 25 years.  Lung cancer screening. / Every year if you are aged 11-80 years and have a 30-pack-year history of smoking and currently smoke or have quit within the past 15 years. Yearly screening is stopped once you have quit smoking for at least 15 years or develop a health problem that would prevent you from having lung cancer treatment.  Clinical breast exam.** / Every year after age 48 years.  BRCA-related cancer risk assessment.** / For women who have family members with a BRCA-related cancer (breast, ovarian, tubal, or peritoneal cancers).  Mammogram.** / Every year beginning at age 41 years and continuing for as long as you are in good health. Consult with your health care provider.  Pap test.** / Every 3 years starting at age 65 years through age 37 or 70 years with a history of 3 consecutive normal Pap tests.  HPV screening.** / Every 3 years from ages 72 years through ages 60 to 40 years with a history of 3 consecutive normal Pap tests.  Fecal occult blood test (FOBT) of stool. / Every year beginning at age 21 years and continuing until age 5 years. You may not need to do this test if you get  a colonoscopy every 10 years.  Flexible sigmoidoscopy or colonoscopy.** / Every 5 years for a flexible sigmoidoscopy or every 10 years for a colonoscopy beginning at age 35 years and continuing until age 48 years.  Hepatitis C blood test.** / For all people born from [birth year] through 1965 and any individual with known risks for hepatitis C.  Skin self-exam. / Monthly.  Influenza vaccine. / Every year.  Tetanus, diphtheria, and acellular pertussis (Tdap/Td) vaccine.** / Consult your health care provider. Pregnant women should receive 1 dose of Tdap vaccine during each pregnancy. 1 dose of Td every 10 years.  Varicella vaccine.** / Consult your health care provider. Pregnant females who do not have evidence of immunity should receive the first dose after pregnancy.  Zoster vaccine.** / 1 dose for adults aged 30 years or older.  Measles, mumps, rubella (MMR) vaccine.** / You need at least 1 dose of MMR if you were born in 1957 or later. You may also need a second dose. For females of childbearing age, rubella immunity should be determined. If there is no evidence of immunity, females who are not pregnant should be vaccinated. If there is no evidence of immunity, females who are pregnant should delay immunization until after pregnancy.  Pneumococcal 13-valent conjugate (PCV13) vaccine.** / Consult your health care provider.  Pneumococcal polysaccharide (PPSV23) vaccine.** / 1 to 2 doses if you smoke cigarettes or if you have certain conditions.  Meningococcal vaccine.** /  Consult your health care provider.  Hepatitis A vaccine.** / Consult your health care provider.  Hepatitis B vaccine.** / Consult your health care provider.  Haemophilus influenzae type b (Hib) vaccine.** / Consult your health care provider. Ages 64 years and over  Blood pressure check.** / Every year.  Lipid and cholesterol check.** / Every 5 years beginning at age 23 years.  Lung cancer screening. / Every year if you  are aged 16-80 years and have a 30-pack-year history of smoking and currently smoke or have quit within the past 15 years. Yearly screening is stopped once you have quit smoking for at least 15 years or develop a health problem that would prevent you from having lung cancer treatment.  Clinical breast exam.** / Every year after age 74 years.  BRCA-related cancer risk assessment.** / For women who have family members with a BRCA-related cancer (breast, ovarian, tubal, or peritoneal cancers).  Mammogram.** / Every year beginning at age 44 years and continuing for as long as you are in good health. Consult with your health care provider.  Pap test.** / Every 3 years starting at age 58 years through age 22 or 39 years with 3 consecutive normal Pap tests. Testing can be stopped between 65 and 70 years with 3 consecutive normal Pap tests and no abnormal Pap or HPV tests in the past 10 years.  HPV screening.** / Every 3 years from ages 64 years through ages 70 or 61 years with a history of 3 consecutive normal Pap tests. Testing can be stopped between 65 and 70 years with 3 consecutive normal Pap tests and no abnormal Pap or HPV tests in the past 10 years.  Fecal occult blood test (FOBT) of stool. / Every year beginning at age 40 years and continuing until age 27 years. You may not need to do this test if you get a colonoscopy every 10 years.  Flexible sigmoidoscopy or colonoscopy.** / Every 5 years for a flexible sigmoidoscopy or every 10 years for a colonoscopy beginning at age 7 years and continuing until age 32 years.  Hepatitis C blood test.** / For all people born from [birth year] through 1967 and any individual with known risks for hepatitis C.  Osteoporosis screening.** / A one-time screening for women ages 30 years and over and women at risk for fractures or osteoporosis.  Skin self-exam. / Monthly.  Influenza vaccine. / Every year.  Tetanus, diphtheria, and acellular pertussis (Tdap/Td)  vaccine.** / 1 dose of Td every 10 years.  Varicella vaccine.** / Consult your health care provider.  Zoster vaccine.** / 1 dose for adults aged 35 years or older.  Pneumococcal 13-valent conjugate (PCV13) vaccine.** / Consult your health care provider.  Pneumococcal polysaccharide (PPSV23) vaccine.** / 1 dose for all adults aged 46 years and older.  Meningococcal vaccine.** / Consult your health care provider.  Hepatitis A vaccine.** / Consult your health care provider.  Hepatitis B vaccine.** / Consult your health care provider.  Haemophilus influenzae type b (Hib) vaccine.** / Consult your health care provider. ** Family history and personal history of risk and conditions may change your health care provider's recommendations.   This information is not intended to replace advice given to you by your health care provider. Make sure you discuss any questions you have with your health care provider.   Document Released: 03/28/2001 Document Revised: 02/20/2014 Document Reviewed: 06/27/2010 Elsevier Interactive Patient Education [employer].

## 2015-12-28 ENCOUNTER — Other Ambulatory Visit: Payer: Self-pay | Admitting: Internal Medicine

## 2015-12-28 ENCOUNTER — Encounter: Payer: Self-pay | Admitting: Internal Medicine

## 2015-12-28 DIAGNOSIS — K648 Other hemorrhoids: Secondary | ICD-10-CM

## 2015-12-28 MED ORDER — HYDROCORTISONE ACE-PRAMOXINE 1-1 % RE FOAM: 1.0000 | Freq: Two times a day (BID) | RECTAL | 1 refills | Status: DC

## 2016-02-23 ENCOUNTER — Other Ambulatory Visit: Payer: Self-pay | Admitting: Internal Medicine

## 2016-02-23 ENCOUNTER — Encounter: Payer: Self-pay | Admitting: Internal Medicine

## 2016-02-23 DIAGNOSIS — K219 Gastro-esophageal reflux disease without esophagitis: Secondary | ICD-10-CM | POA: Insufficient documentation

## 2016-02-23 MED ORDER — RANITIDINE HCL 150 MG PO TABS: ORAL_TABLET | ORAL | 1 refills | Status: DC

## 2016-08-14 ENCOUNTER — Encounter: Payer: Self-pay | Admitting: Internal Medicine

## 2016-08-14 ENCOUNTER — Other Ambulatory Visit (INDEPENDENT_AMBULATORY_CARE_PROVIDER_SITE_OTHER): Payer: 59

## 2016-08-14 ENCOUNTER — Ambulatory Visit (INDEPENDENT_AMBULATORY_CARE_PROVIDER_SITE_OTHER): Payer: 59 | Admitting: Internal Medicine

## 2016-08-14 VITALS — BP 100/60 | HR 63 | Temp 98.5°F | Resp 16 | Ht 61.25 in | Wt 122.0 lb

## 2016-08-14 DIAGNOSIS — M7989 Other specified soft tissue disorders: Secondary | ICD-10-CM | POA: Insufficient documentation

## 2016-08-14 DIAGNOSIS — R202 Paresthesia of skin: Secondary | ICD-10-CM | POA: Diagnosis not present

## 2016-08-14 DIAGNOSIS — R200 Anesthesia of skin: Secondary | ICD-10-CM

## 2016-08-14 DIAGNOSIS — M5412 Radiculopathy, cervical region: Secondary | ICD-10-CM

## 2016-08-14 DIAGNOSIS — M4802 Spinal stenosis, cervical region: Secondary | ICD-10-CM | POA: Insufficient documentation

## 2016-08-14 LAB — CBC WITH DIFFERENTIAL/PLATELET
BASOS PCT: 0.5 % (ref 0.0–3.0)
Basophils Absolute: 0.1 10*3/uL (ref 0.0–0.1)
Eosinophils Absolute: 0.2 10*3/uL (ref 0.0–0.7)
Eosinophils Relative: 1.8 % (ref 0.0–5.0)
HEMATOCRIT: 42.1 % (ref 36.0–46.0)
Hemoglobin: 14.4 g/dL (ref 12.0–15.0)
LYMPHS ABS: 2.7 10*3/uL (ref 0.7–4.0)
LYMPHS PCT: 28.3 % (ref 12.0–46.0)
MCHC: 34.3 g/dL (ref 30.0–36.0)
MCV: 89.9 fl (ref 78.0–100.0)
MONOS PCT: 9 % (ref 3.0–12.0)
Monocytes Absolute: 0.9 10*3/uL (ref 0.1–1.0)
NEUTROS ABS: 5.7 10*3/uL (ref 1.4–7.7)
Neutrophils Relative %: 60.4 % (ref 43.0–77.0)
PLATELETS: 264 10*3/uL (ref 150.0–400.0)
RBC: 4.69 Mil/uL (ref 3.87–5.11)
RDW: 12.4 % (ref 11.5–15.5)
WBC: 9.5 10*3/uL (ref 4.0–10.5)

## 2016-08-14 LAB — COMPREHENSIVE METABOLIC PANEL
ALT: 10 U/L (ref 0–35)
AST: 15 U/L (ref 0–37)
Albumin: 4 g/dL (ref 3.5–5.2)
Alkaline Phosphatase: 53 U/L (ref 39–117)
BUN: 19 mg/dL (ref 6–23)
CALCIUM: 9.4 mg/dL (ref 8.4–10.5)
CHLORIDE: 101 meq/L (ref 96–112)
CO2: 28 meq/L (ref 19–32)
CREATININE: 0.74 mg/dL (ref 0.40–1.20)
GFR: 90.91 mL/min (ref >60.00)
GLUCOSE: 89 mg/dL (ref 70–99)
Potassium: 4.1 mEq/L (ref 3.5–5.1)
Sodium: 137 mEq/L (ref 135–145)
Total Bilirubin: 0.5 mg/dL (ref 0.2–1.2)
Total Protein: 7 g/dL (ref 6.0–8.3)

## 2016-08-14 LAB — D-DIMER, QUANTITATIVE: D-Dimer, Quant: 0.22 mcg/mL FEU (ref <0.50)

## 2016-08-14 LAB — TSH: TSH: 0.61 u[IU]/mL (ref 0.35–4.50)

## 2016-08-14 NOTE — Progress Notes (Signed)
Subjective:  Patient ID: Briana Sanchez, female    DOB: [birth year]  Age: 41 y.o. MRN: [id number]  CC: Neck Pain   HPI Briana Sanchez presents for chronic, recurrent episodes of neck pain that is usually worse on the right than the left but now she has numbness and tingling diffusely in her left upper extremity. She is concerned that her left arm is larger than her right arm as her husband recently measured her left arm and found that it was about a centimeter larger than her right arm. She is right handed. She has not noticed any lymphadenopathy around her neck or left upper extremity and has not noticed any swollen areas or changes in the skin in her left upper extremity.  Outpatient Medications Prior to Visit  Medication Sig Dispense Refill  . azelastine (ASTELIN) 137 MCG/SPRAY nasal spray 1-2 sprays each nostril once or twice daily as needed 30 mL 12  . fexofenadine (ALLEGRA) 180 MG tablet Take 1 tablet (180 mg total) by mouth daily.    . hydrocortisone-pramoxine (PROCTOFOAM-HC) rectal foam Place 1 applicator rectally 2 (two) times daily. 10 g 1  . Multiple Vitamin (MULTIVITAMIN) tablet Take 1 tablet by mouth daily.    Briana Sanchez Estradiol (ORTHO-CYCLEN, 28, PO) Take by mouth.      Marland Kitchen olopatadine (PATANOL) 0.1 % ophthalmic solution Place 1 drop into both eyes 2 (two) times daily. 5 mL 12  . ranitidine (ZANTAC) 150 MG tablet TAKE 1 TABLET (150 MG TOTAL) BY MOUTH 2 (TWO) TIMES DAILY. 180 tablet 1   No facility-administered medications prior to visit.     ROS Review of Systems  Constitutional: Negative.   HENT: Negative.  Negative for trouble swallowing.   Eyes: Negative.   Respiratory: Negative.  Negative for cough and shortness of breath.   Cardiovascular: Negative.  Negative for chest pain, palpitations and leg swelling.  Gastrointestinal: Negative.  Negative for abdominal pain.  Endocrine: Negative.   Genitourinary: Negative.   Musculoskeletal: Positive for neck  pain. Negative for arthralgias, back pain and neck stiffness.  Skin: Negative.  Negative for color change, pallor and rash.  Allergic/Immunologic: Negative.   Neurological: Positive for numbness. Negative for dizziness, tremors, seizures, syncope, facial asymmetry, speech difficulty, weakness, light-headedness and headaches.  Hematological: Negative.  Negative for adenopathy. Does not bruise/bleed easily.  Psychiatric/Behavioral: Negative.     Objective:  BP 100/60 (BP Location: Left Arm, Patient Position: Sitting, Cuff Size: Normal)   Pulse 63   Temp 98.5 F (36.9 C) (Oral)   Resp 16   Ht 5' 1.25" (1.556 m)   Wt 122 lb (55.3 kg)   LMP 07/31/2016   SpO2 99%   BMI 22.86 kg/m   BP Readings from Last 3 Encounters:  08/14/16 100/60  09/13/15 116/68  06/17/15 98/62    Wt Readings from Last 3 Encounters:  08/14/16 122 lb (55.3 kg)  09/13/15 122 lb (55.3 kg)  06/17/15 123 lb 6.4 oz (56 kg)    Physical Exam  Constitutional: No distress.  HENT:  Mouth/Throat: Oropharynx is clear and moist. No oropharyngeal exudate.  Eyes: Conjunctivae are normal. Right eye exhibits no discharge. Left eye exhibits no discharge. No scleral icterus.  Neck: Normal range of motion. Neck supple. No thyromegaly present.  Cardiovascular: Normal rate, regular rhythm and intact distal pulses.  Exam reveals no gallop and no friction rub.   No murmur heard. Pulmonary/Chest: Effort normal and breath sounds normal. No respiratory distress. She has no wheezes. She has  no rales. She exhibits no tenderness.  Abdominal: Soft. Bowel sounds are normal. She exhibits no distension and no mass. There is no tenderness. There is no rebound and no guarding.  Musculoskeletal: Normal range of motion. She exhibits no edema or tenderness.       Cervical back: Normal. She exhibits normal range of motion, no tenderness, no bony tenderness, no swelling, no edema, no pain and no spasm.  Lymphadenopathy:       Head (right side):  No occipital adenopathy present.       Head (left side): No occipital adenopathy present.    She has no cervical adenopathy.    She has no axillary adenopathy.       Right: No supraclavicular and no epitrochlear adenopathy present.       Left: No supraclavicular and no epitrochlear adenopathy present.  Neurological: She displays abnormal reflex. She displays no atrophy and no tremor. No cranial nerve deficit or sensory deficit. She exhibits normal muscle tone. She displays a negative Romberg sign. She displays no seizure activity. Coordination and gait normal.  Reflex Scores:      Tricep reflexes are 1+ on the right side and 0 on the left side.      Bicep reflexes are 1+ on the right side and 0 on the left side.      Brachioradialis reflexes are 1+ on the right side and 0 on the left side.      Patellar reflexes are 1+ on the right side and 1+ on the left side.      Achilles reflexes are 0 on the right side and 0 on the left side. There is no muscle atrophy or swelling in her upper extremities  Skin: Skin is warm and dry. No rash noted. She is not diaphoretic. No erythema.  Vitals reviewed.   Lab Results  Component Value Date   WBC 11.9 (H) 09/13/2015   HGB 14.9 09/13/2015   HCT 43.2 09/13/2015   PLT 254.0 09/13/2015   GLUCOSE 83 09/13/2015   CHOL 195 09/13/2015   TRIG 107.0 09/13/2015   HDL 88.40 09/13/2015   LDLCALC 85 09/13/2015   ALT 11 09/13/2015   AST 15 09/13/2015   NA 137 09/13/2015   K 4.6 09/13/2015   CL 101 09/13/2015   CREATININE 0.83 09/13/2015   BUN 14 09/13/2015   CO2 29 09/13/2015   TSH 1.23 09/13/2015    Dg Chest 2 View  Result Date: 04/06/2011 *RADIOLOGY REPORT* Clinical Data: Follow up pneumonia CHEST - 2 VIEW Comparison: 03/22/2011. Findings: Cardiomediastinal silhouette is stable.  No acute infiltrate or pulmonary edema.  The previous infiltrate in the lingula has cleared.  No pleural effusion.  Bony thorax is stable. IMPRESSION: No active disease.   Improvement in aeration.  No new infiltrate. Original Report Authenticated By: Lahoma Crocker, M.D.   Assessment & Plan:   [name] was seen today for neck pain.  Diagnoses and all orders for this visit:  Numbness and tingling in left arm- she has diminished deep tendon reflexes in her left upper extremity raising concern that she has a peripheral lesion somewhere in the cervical spine so I ordered an MRI of her cervical spine and a NCS/EMG to try to identify the source of the neurological lesion. -     MR CERVICAL SPINE WO CONTRAST; Future -     Ambulatory referral to Neurology  Radiculitis of left cervical region- as above -     MR CERVICAL SPINE WO CONTRAST; Future -  Ambulatory referral to Neurology  Left arm swelling- I think the asymmetry in her upper extremities is just normal asymmetry as her labs today show no evidence of thrombosis or other metabolic causes that would cause upper extremity asymmetry and the exam, other than the neuro findings, is WNL -     Comprehensive metabolic panel; Future -     CBC with Differential/Platelet; Future -     TSH; Future -     D-dimer, quantitative (not at [facility]); Future   I am having Ms. Debruyn maintain her ([employer]-Eth Estradiol (ORTHO-CYCLEN, 28, PO)), fexofenadine, multivitamin, azelastine, olopatadine, hydrocortisone-pramoxine, and ranitidine.  No orders of the defined types were placed in this encounter.    Follow-up: No Follow-up on file.  Scarlette Calico, MD

## 2016-08-14 NOTE — Patient Instructions (Signed)
Cervical Sprain A cervical sprain is a stretch or tear in one or more of the tough, cord-like tissues that connect bones (ligaments) in the neck. Cervical sprains can range from mild to severe. Severe cervical sprains can cause the spinal bones (vertebrae) in the neck to be unstable. This can lead to spinal cord damage and can result in serious nervous system problems. The amount of time that it takes for a cervical sprain to get better depends on the cause and extent of the injury. Most cervical sprains heal in 4-6 weeks. What are the causes? Cervical sprains may be caused by an injury (trauma), such as from a motor vehicle accident, a fall, or sudden forward and backward whipping movement of the head and neck (whiplash injury). Mild cervical sprains may be caused by wear and tear over time, such as from poor posture, sitting in a chair that does not provide support, or looking up or down for long periods of time. What increases the risk? The following factors may make you more likely to develop this condition:  Participating in activities that have a high risk of trauma to the neck. These include contact sports, auto racing, gymnastics, and diving.  Taking risks when driving or riding in a motor vehicle, such as speeding.  Having osteoarthritis of the spine.  Having poor strength and flexibility of the neck.  A previous neck injury.  Having poor posture.  Spending a lot of time in certain positions that put stress on the neck, such as sitting at a computer for long periods of time. What are the signs or symptoms? Symptoms of this condition include:  Pain, soreness, stiffness, tenderness, swelling, or a burning sensation in the front, back, or sides of the neck.  Sudden tightening of neck muscles that you cannot control (muscle spasms).  Pain in the shoulders or upper back.  Limited ability to move the neck.  Headache.  Dizziness.  Nausea.  Vomiting.  Weakness, numbness, or  tingling in a hand or an arm. Symptoms may develop right away after injury, or they may develop over a few days. In some cases, symptoms may go away with treatment and return (recur) over time. How is this diagnosed? This condition may be diagnosed based on:  Your medical history.  Your symptoms.  Any recent injuries or known neck problems that you have, such as arthritis in the neck.  A physical exam.  Imaging tests, such as:  X-rays.  MRI.  CT scan. How is this treated? This condition is treated by resting and icing the injured area and doing physical therapy exercises. Depending on the severity of your condition, treatment may also include:  Keeping your neck in place (immobilized) for periods of time. This may be done using:  A cervical collar. This supports your chin and the back of your head.  A cervical traction device. This is a sling that holds up your head. This removes weight and pressure from your neck, and it may help to relieve pain.  Medicines that help to relieve pain and inflammation.  Medicines that help to relax your muscles (muscle relaxants).  Surgery. This is rare. Follow these instructions at home: If you have a cervical collar:   Wear it as told by your health care provider. Do not remove the collar unless instructed by your health care provider.  Ask your health care provider before you make any adjustments to your collar.  If you have long hair, keep it outside of the collar.    Ask your health care provider if you can remove the collar for cleaning and bathing. If you are allowed to remove the collar for cleaning or bathing:  Follow instructions from your health care provider about how to remove the collar safely.  Clean the collar by wiping it with mild soap and water and drying it completely.  If your collar has removable pads, remove them every 1-2 days and wash them by hand with soap and water. Let them air-dry completely before you put  them back in the collar.  Check your skin under the collar for irritation or sores. If you see any, tell your health care provider. Managing pain, stiffness, and swelling   If directed, use a cervical traction device as told by your health care provider.  If directed, apply heat to the affected area before you do your physical therapy or as often as told by your health care provider. Use the heat source that your health care provider recommends, such as a moist heat pack or a heating pad.  Place a towel between your skin and the heat source.  Leave the heat on for 20-30 minutes.  Remove the heat if your skin turns bright red. This is especially important if you are unable to feel pain, heat, or cold. You may have a greater risk of getting burned.  If directed, put ice on the affected area:  Put ice in a plastic bag.  Place a towel between your skin and the bag.  Leave the ice on for 20 minutes, 2-3 times a day. Activity   Do not drive while wearing a cervical collar. If you do not have a cervical collar, ask your health care provider if it is safe to drive while your neck heals.  Do not drive or use heavy machinery while taking prescription pain medicine or muscle relaxants, unless your health care provider approves.  Do not lift anything that is heavier than 10 lb (4.5 kg) until your health care provider tells you that it is safe.  Rest as directed by your health care provider. Avoid positions and activities that make your symptoms worse. Ask your health care provider what activities are safe for you.  If physical therapy was prescribed, do exercises as told by your health care provider or physical therapist. General instructions   Take over-the-counter and prescription medicines only as told by your health care provider.  Do not use any products that contain nicotine or tobacco, such as cigarettes and e-cigarettes. These can delay healing. If you need help quitting, ask your  health care provider.  Keep all follow-up visits as told by your health care provider or physical therapist. This is important. How is this prevented? To prevent a cervical sprain from happening again:  Use and maintain good posture. Make any needed adjustments to your workstation to help you use good posture.  Exercise regularly as directed by your health care provider or physical therapist.  Avoid risky activities that may cause a cervical sprain. Contact a health care provider if:  You have symptoms that get worse or do not get better after 2 weeks of treatment.  You have pain that gets worse or does not get better with medicine.  You develop new, unexplained symptoms.  You have sores or irritated skin on your neck from wearing your cervical collar. Get help right away if:  You have severe pain.  You develop numbness, tingling, or weakness in any part of your body.  You cannot move   a part of your body (you have paralysis).  You have neck pain along with:  Severe dizziness.  Headache. Summary  A cervical sprain is a stretch or tear in one or more of the tough, cord-like tissues that connect bones (ligaments) in the neck.  Cervical sprains may be caused by an injury (trauma), such as from a motor vehicle accident, a fall, or sudden forward and backward whipping movement of the head and neck (whiplash injury).  Symptoms may develop right away after injury, or they may develop over a few days.  This condition is treated by resting and icing the injured area and doing physical therapy exercises. This information is not intended to replace advice given to you by your health care provider. Make sure you discuss any questions you have with your health care provider. Document Released: 11/27/2006 Document Revised: 09/29/2015 Document Reviewed: 09/29/2015 Elsevier Interactive Patient Education  2017 [employer]  

## 2016-08-15 ENCOUNTER — Encounter: Payer: Self-pay | Admitting: Neurology

## 2016-08-17 ENCOUNTER — Encounter: Payer: Self-pay | Admitting: Internal Medicine

## 2016-08-20 ENCOUNTER — Other Ambulatory Visit: Payer: Self-pay | Admitting: Internal Medicine

## 2016-08-20 DIAGNOSIS — K219 Gastro-esophageal reflux disease without esophagitis: Secondary | ICD-10-CM

## 2016-08-23 NOTE — Addendum Note (Signed)
Addended by: Aviva Signs M on: 08/23/2016 04:31 PM   Modules accepted: Orders

## 2016-08-29 ENCOUNTER — Encounter: Payer: Self-pay | Admitting: Internal Medicine

## 2016-09-04 NOTE — Telephone Encounter (Signed)
Left msg for pt to call back

## 2016-09-18 ENCOUNTER — Ambulatory Visit
Admission: RE | Admit: 2016-09-18 | Discharge: 2016-09-18 | Disposition: A | Discharge status: Home / Self Care | Payer: 59 | Source: Ambulatory Visit | Attending: Internal Medicine | Admitting: Internal Medicine

## 2016-09-18 DIAGNOSIS — R200 Anesthesia of skin: Secondary | ICD-10-CM

## 2016-09-18 DIAGNOSIS — M5412 Radiculopathy, cervical region: Secondary | ICD-10-CM

## 2016-09-18 DIAGNOSIS — R202 Paresthesia of skin: Principal | ICD-10-CM

## 2016-09-19 ENCOUNTER — Ambulatory Visit (INDEPENDENT_AMBULATORY_CARE_PROVIDER_SITE_OTHER): Payer: 59 | Admitting: Neurology

## 2016-09-19 ENCOUNTER — Encounter: Payer: Self-pay | Admitting: Internal Medicine

## 2016-09-19 ENCOUNTER — Other Ambulatory Visit: Payer: Self-pay | Admitting: *Deleted

## 2016-09-19 ENCOUNTER — Other Ambulatory Visit: Payer: Self-pay | Admitting: Internal Medicine

## 2016-09-19 DIAGNOSIS — R200 Anesthesia of skin: Secondary | ICD-10-CM | POA: Diagnosis not present

## 2016-09-19 DIAGNOSIS — R202 Paresthesia of skin: Secondary | ICD-10-CM

## 2016-09-19 DIAGNOSIS — M5412 Radiculopathy, cervical region: Secondary | ICD-10-CM

## 2016-09-19 DIAGNOSIS — M4802 Spinal stenosis, cervical region: Secondary | ICD-10-CM

## 2016-09-19 NOTE — Procedures (Signed)
[facility]  [place], [address]  [place], [place] [address] Tel: [phone] Fax:  [phone] Test Date:  09/19/2016  Patient: Briana Sanchez DOB: 1975-07-02 Physician: Narda Amber, DO  Sex: Female Height: 5' 1"  Ref Phys: Scarlette Calico, M.D.  ID#: [id number] Temp: 34.0C Technician:    Patient Complaints: This is a 41 year-old female referred for evaluation of neck pain and left arm tingling.  NCV & EMG Findings: Extensive electrodiagnostic testing of the left upper extremity shows:  1. Left median, ulnar, and mixed palmer sensory responses are within normal limits. 2. Left median and ulnar motor responses are within normal limits. 3. There is no evidence of active or chronic motor axon loss changes affecting any of the tested muscles. Motor unit configuration and recruitment pattern is within normal limits.  Impression: This is a normal study of the left upper extremity. In particular, there is no evidence of a cervical radiculopathy or carpal tunnel syndrome.   ___________________________ Narda Amber, DO    Nerve Conduction Studies Anti Sensory Summary Table   Site NR Peak (ms) Norm Peak (ms) P-T Amp (V) Norm P-T Amp  Left Median Anti Sensory (2nd Digit)  34C  Wrist    3.3 <3.4 105.4 >20  Left Ulnar Anti Sensory (5th Digit)  34C  Wrist    2.7 <3.1 96.0 >12   Motor Summary Table   Site NR Onset (ms) Norm Onset (ms) O-P Amp (mV) Norm O-P Amp Site1 Site2 Delta-0 (ms) Dist (cm) Vel (m/s) Norm Vel (m/s)  Left Median Motor (Abd Poll Brev)  34C  Wrist    3.0 <3.9 9.6 >6 Elbow Wrist 4.3 24.5 57 >50  Elbow    7.3  9.4         Left Ulnar Motor (Abd Dig Minimi)  34C  Wrist    2.3 <3.1 8.0 >7 B Elbow Wrist 3.1 20.0 65 >50  B Elbow    5.4  7.9  A Elbow B Elbow 1.7 10.0 59 >50  A Elbow    7.1  7.9          Comparison Summary Table   Site NR Peak (ms) Norm Peak (ms) P-T Amp (V) Site1 Site2 Delta-P (ms) Norm Delta (ms)  Left Median/Ulnar Palm  Comparison (Wrist - 8cm)  34C  Median Palm    1.8 <2.2 95.0 Median Palm Ulnar Palm 0.3   Ulnar Palm    1.5 <2.2 30.3       EMG   Side Muscle Ins Act Fibs Psw Fasc Number Recrt Dur Dur. Amp Amp. Poly Poly. Comment  Left 1stDorInt Nml Nml Nml Nml Nml Nml Nml Nml Nml Nml Nml Nml N/A  Left Ext Indicis Nml Nml Nml Nml Nml Nml Nml Nml Nml Nml Nml Nml N/A  Left PronatorTeres Nml Nml Nml Nml Nml Nml Nml Nml Nml Nml Nml Nml N/A  Left Biceps Nml Nml Nml Nml Nml Nml Nml Nml Nml Nml Nml Nml N/A  Left Triceps Nml Nml Nml Nml Nml Nml Nml Nml Nml Nml Nml Nml N/A  Left Cervical Parasp Low Nml Nml Nml Nml NE - - - - - - - N/A      Waveforms:

## 2016-09-19 NOTE — Procedures (Deleted)
[facility]  [address], [place]  [place], [place] [address] Tel: [phone] Fax:  [phone] Test Date:  09/19/2016  Patient: Briana Sanchez DOB: [birth year] Physician: Narda Amber, DO  Sex: Female Height: 5' 1"  Ref Phys: Scarlette Calico, M.D.  ID#: [id number] Temp: 34.0C Technician:    Patient Complaints: This is a 41 year-old female referred for evaluation of neck pain and left arm tingling.  NCV & EMG Findings: Extensive electrodiagnostic testing of the left upper extremity shows:  1. Left median, ulnar, and mixed palmer sensory responses are within normal limits. 2. Left median and ulnar motor responses are within normal limits. 3. There is no evidence of active or chronic motor axon loss changes affecting any of the tested muscles. Motor unit configuration and recruitment pattern is within normal limits.  Impression: This is a normal study of the left upper extremity. In particular, there is no evidence of a cervical radiculopathy or carpal tunnel syndrome.   ___________________________ Narda Amber, DO    Nerve Conduction Studies Anti Sensory Summary Table   Site NR Peak (ms) Norm Peak (ms) P-T Amp (V) Norm P-T Amp  Left Median Anti Sensory (2nd Digit)  34C  Wrist    3.3 <3.4 105.4 >20  Left Ulnar Anti Sensory (5th Digit)  34C  Wrist    2.7 <3.1 96.0 >12   Motor Summary Table   Site NR Onset (ms) Norm Onset (ms) O-P Amp (mV) Norm O-P Amp Site1 Site2 Delta-0 (ms) Dist (cm) Vel (m/s) Norm Vel (m/s)  Left Median Motor (Abd Poll Brev)  34C  Wrist    3.0 <3.9 9.6 >6 Elbow Wrist 4.3 24.5 57 >50  Elbow    7.3  9.4         Left Ulnar Motor (Abd Dig Minimi)  34C  Wrist    2.3 <3.1 8.0 >7 B Elbow Wrist 3.1 20.0 65 >50  B Elbow    5.4  7.9  A Elbow B Elbow 1.7 10.0 59 >50  A Elbow    7.1  7.9          Comparison Summary Table   Site NR Peak (ms) Norm Peak (ms) P-T Amp (V) Site1 Site2 Delta-P (ms) Norm Delta (ms)  Left Median/Ulnar Palm  Comparison (Wrist - 8cm)  34C  Median Palm    1.8 <2.2 95.0 Median Palm Ulnar Palm 0.3   Ulnar Palm    1.5 <2.2 30.3       EMG   Side Muscle Ins Act Fibs Psw Fasc Number Recrt Dur Dur. Amp Amp. Poly Poly. Comment  Left 1stDorInt Nml Nml Nml Nml Nml Nml Nml Nml Nml Nml Nml Nml N/A  Left Ext Indicis Nml Nml Nml Nml Nml Nml Nml Nml Nml Nml Nml Nml N/A  Left PronatorTeres Nml Nml Nml Nml Nml Nml Nml Nml Nml Nml Nml Nml N/A  Left Biceps Nml Nml Nml Nml Nml Nml Nml Nml Nml Nml Nml Nml N/A  Left Triceps Nml Nml Nml Nml Nml Nml Nml Nml Nml Nml Nml Nml N/A  Left Cervical Parasp Low Nml Nml Nml Nml NE - - - - - - - N/A      Waveforms:

## 2016-09-26 ENCOUNTER — Encounter: Payer: Self-pay | Admitting: Internal Medicine

## 2016-11-20 ENCOUNTER — Ambulatory Visit: Payer: 59 | Admitting: Neurology

## 2017-02-22 ENCOUNTER — Telehealth: Payer: Self-pay

## 2017-02-22 ENCOUNTER — Other Ambulatory Visit: Payer: Self-pay | Admitting: Internal Medicine

## 2017-02-22 DIAGNOSIS — K219 Gastro-esophageal reflux disease without esophagitis: Secondary | ICD-10-CM

## 2017-02-22 MED ORDER — RANITIDINE HCL 150 MG PO TABS: ORAL_TABLET | ORAL | 1 refills | Status: DC

## 2017-02-22 NOTE — Telephone Encounter (Signed)
LVM for patient to call back and sch a CPE

## 2017-02-22 NOTE — Telephone Encounter (Signed)
[facility] sent rf rq for ranitidine 150 mg tablet.   Pt is due for CPE. Can you call pt and see if she will schedule? Will send in refills once she has an appt.

## 2017-08-21 LAB — HM PAP SMEAR

## 2017-08-23 LAB — HM MAMMOGRAPHY: HM MAMMO: NORMAL (ref 0–4)

## 2017-09-28 IMAGING — MR MR CERVICAL SPINE W/O CM
5 series · 34 of 48 positions shown · non-contrast
Comparison: None available.

CLINICAL DATA: Initial evaluation for neck pain with right arm
numbness for several months.

EXAM:
MRI CERVICAL SPINE WITHOUT CONTRAST
TECHNIQUE: Multiplanar, multisequence MR imaging of the cervical spine was
performed. No intravenous contrast was administered.

[Series 6: T1 · sagittal · 3.3mm · 0.56mm/px · 8 of 13 slices shown]
[im 1/13]
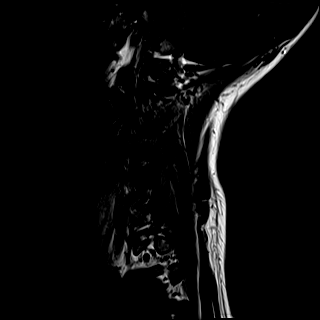
[im 2/13]
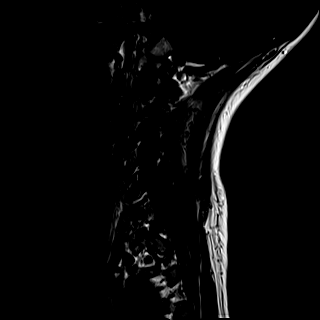
[im 4/13]
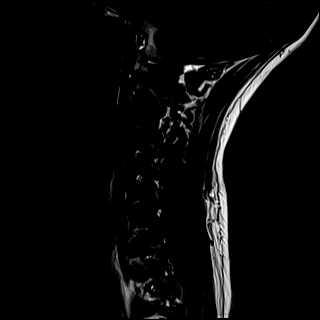
[im 6/13]
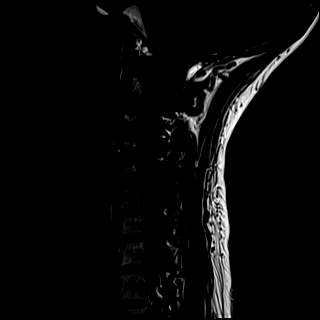
[im 7/13]
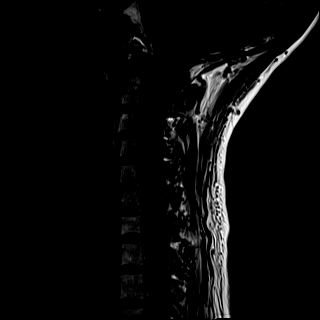
[im 9/13]
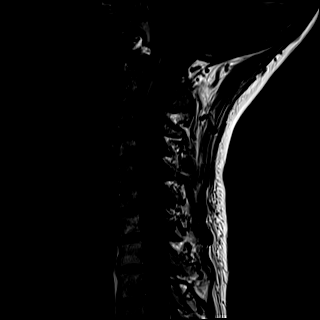
[im 11/13]
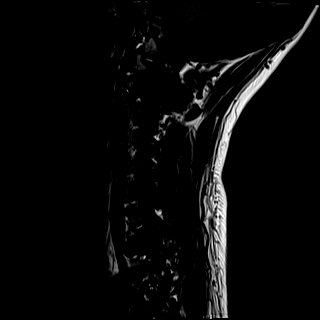
[im 13/13]
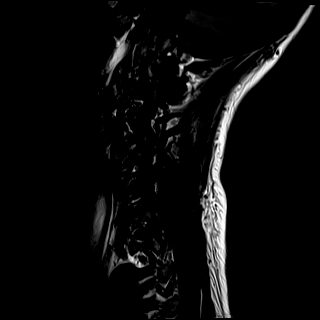

[Series 7: T2 · sagittal · 3.3mm · 0.56mm/px · 7 of 13 slices shown (1 of 2)]
[im 1/13]
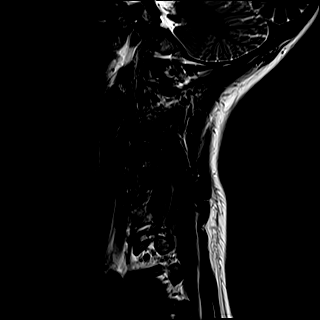
[im 3/13]
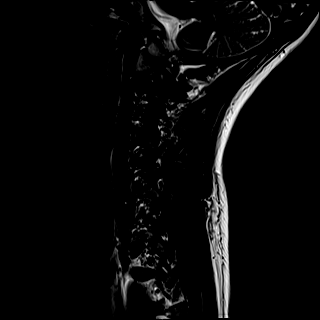
[im 5/13]
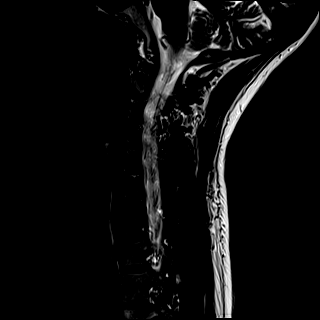
[im 7/13]
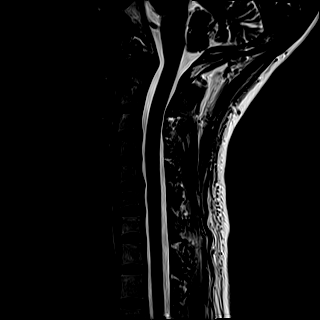
[im 9/13]
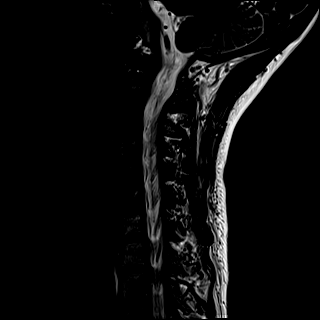
[im 11/13]
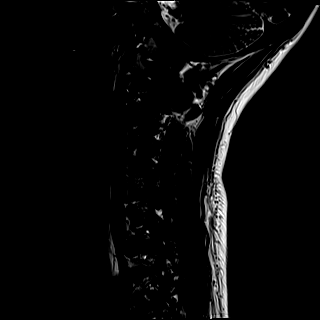
[im 13/13]
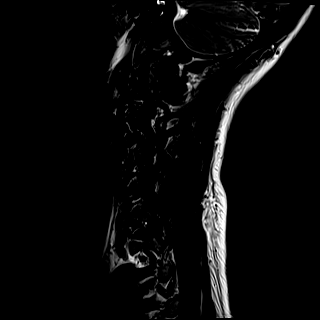

[Series 8: STIR · sagittal · 3.3mm · 0.35mm/px · 7 of 13 slices shown]
[im 1/13]
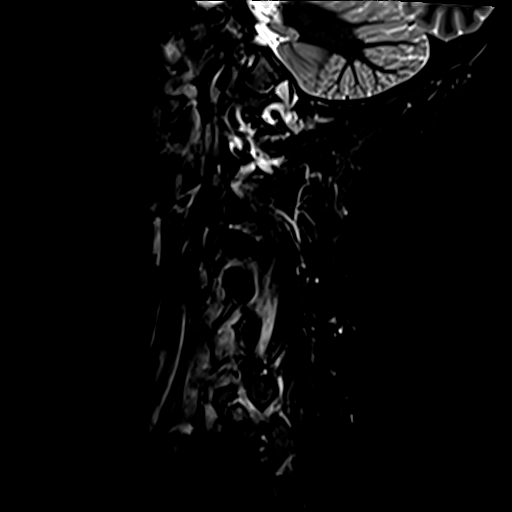
[im 3/13]
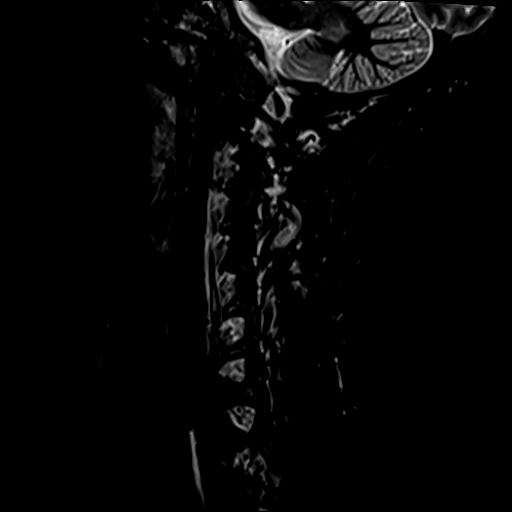
[im 5/13]
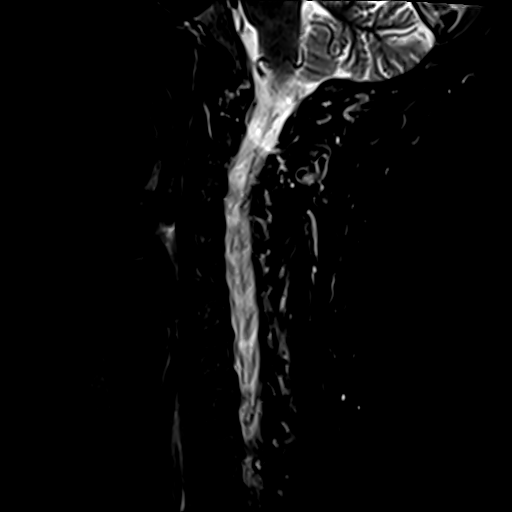
[im 7/13]
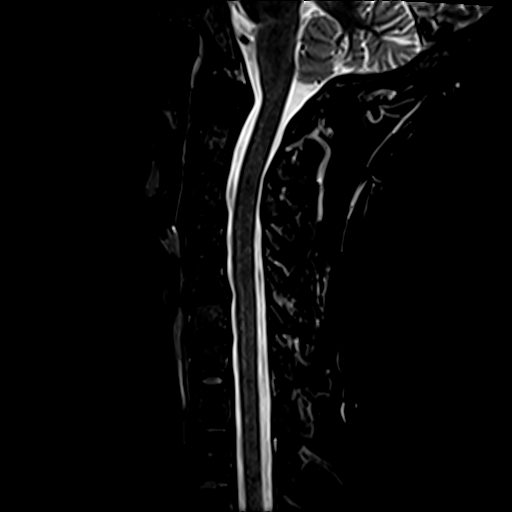
[im 9/13]
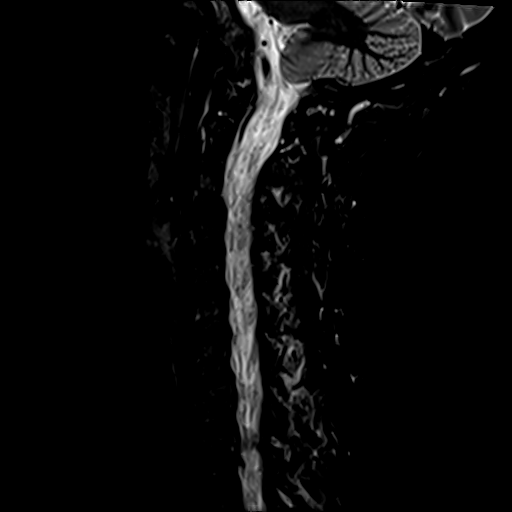
[im 11/13]
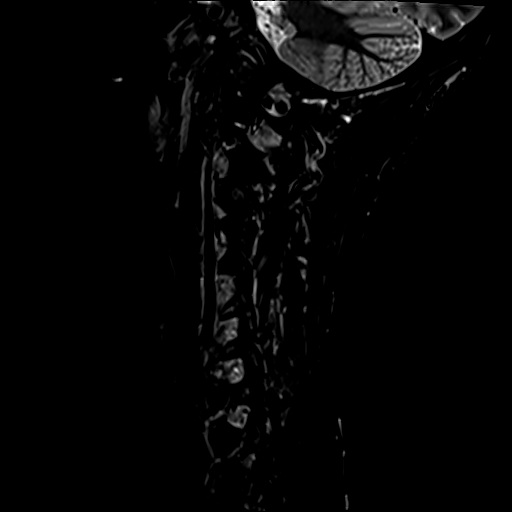
[im 13/13]
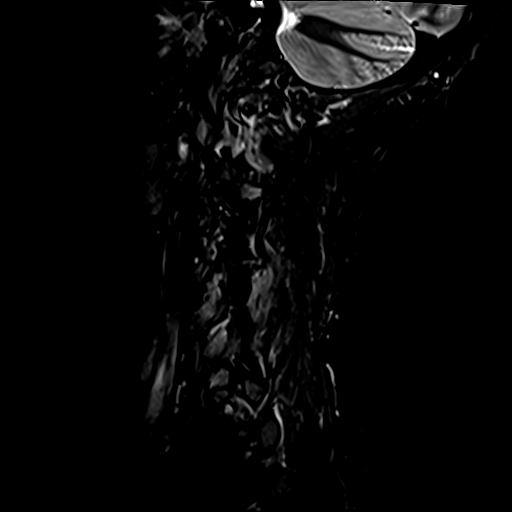

[Series 9: T2 · axial · 3.0mm · 0.50mm/px · z∈[-45,+31]mm · 9 of 24 slices shown (2 of 2)]
[im 1/24]
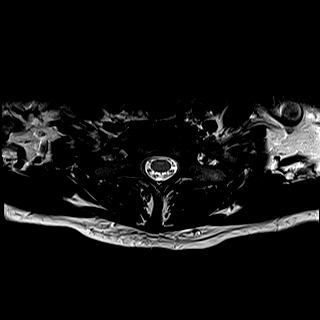
[im 4/24]
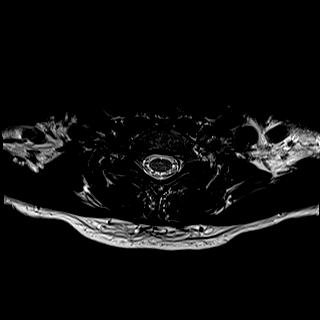
[im 8/24]
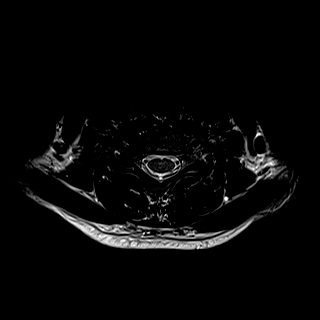
[im 10/24]
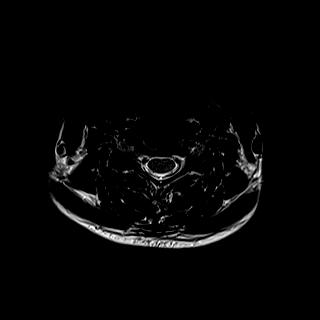
[im 12/24]
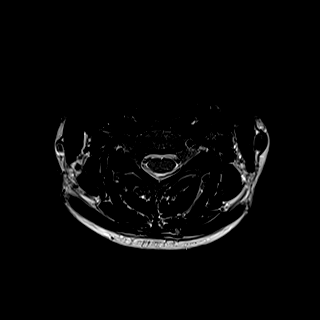
[im 14/24]
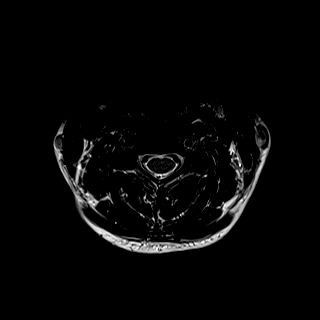
[im 16/24]
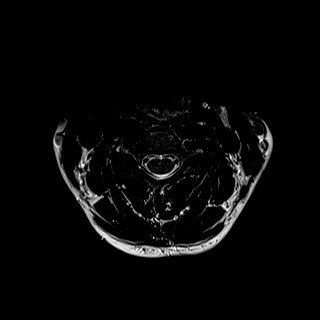
[im 20/24]
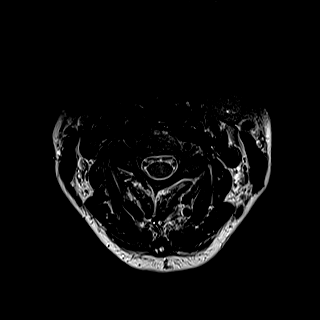
[im 24/24]
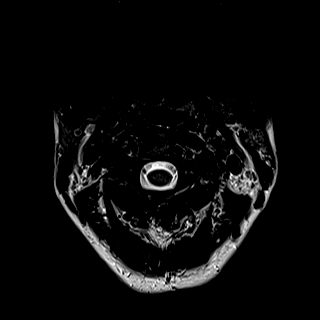

[Series 10: GRE · axial · 3.0mm · 0.42mm/px · z∈[-45,-22]mm · 3 of 24 slices shown]
[im 1/24]
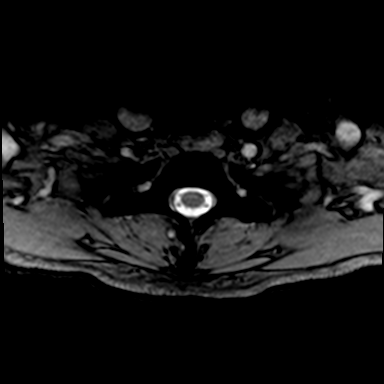
[im 4/24]
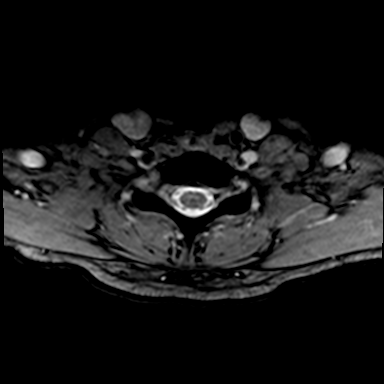
[im 8/24]
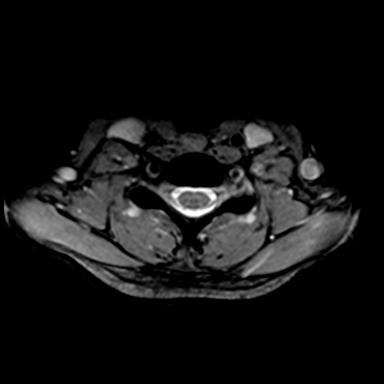

[34 of 48 positions shown; findings below may reference images not displayed]

FINDINGS: Alignment: Straightening of the normal cervical lordosis. Trace
anterolisthesis of C3 on C4, likely due to facet degeneration.

Vertebrae: Vertebral body heights are maintained. No evidence for
acute or chronic fracture. Signal intensity within the vertebral
body bone marrow is normal. No discrete or worrisome osseous
lesions. No abnormal marrow edema.

Cord: Signal intensity within the cervical spinal cord is normal.

Posterior Fossa, vertebral arteries, paraspinal tissues: Visualized
brain and posterior fossa within normal limits. Craniocervical
junction normal. Paraspinous and prevertebral soft tissues within
normal limits. Normal intravascular flow voids present within the
vertebral arteries bilaterally.

Disc levels:

C2-C3: Unremarkable.

C3-C4: Trace anterolisthesis. Mild disc bulge with right-sided
uncovertebral hypertrophy. Right-sided facet degeneration. Resultant
mild right C4 foraminal stenosis. No canal or left foraminal
narrowing.

C4-C5:  Unremarkable.

C5-C6: Mild disc bulge. No significant canal or foraminal stenosis.

C6-C7:  Unremarkable.

C7-T1:  Unremarkable.

Visualized upper thoracic spine within normal limits.
IMPRESSION: 1. Mild disc bulge and right-sided uncovertebral hypertrophy with
facet degeneration at C3-4, resulting in mild right C4 foraminal
stenosis.
2. Mild disc bulge at C5-6 without stenosis.
3. Otherwise unremarkable MRI of the cervical spine.

## 2017-10-16 ENCOUNTER — Encounter: Payer: Self-pay | Admitting: Internal Medicine

## 2017-10-16 ENCOUNTER — Ambulatory Visit (INDEPENDENT_AMBULATORY_CARE_PROVIDER_SITE_OTHER): Payer: 59 | Admitting: Internal Medicine

## 2017-10-16 ENCOUNTER — Other Ambulatory Visit (INDEPENDENT_AMBULATORY_CARE_PROVIDER_SITE_OTHER): Payer: 59

## 2017-10-16 VITALS — BP 100/72 | HR 62 | Temp 97.8°F | Resp 16 | Ht 61.0 in | Wt 127.0 lb

## 2017-10-16 DIAGNOSIS — Z0000 Encounter for general adult medical examination without abnormal findings: Secondary | ICD-10-CM

## 2017-10-16 DIAGNOSIS — J309 Allergic rhinitis, unspecified: Principal | ICD-10-CM

## 2017-10-16 DIAGNOSIS — H1010 Acute atopic conjunctivitis, unspecified eye: Secondary | ICD-10-CM

## 2017-10-16 LAB — LIPID PANEL
Cholesterol: 179 mg/dL (ref 0–200)
HDL: 76.8 mg/dL (ref >39.00)
LDL Cholesterol: 82 mg/dL (ref 0–99)
NonHDL: 101.87
Total CHOL/HDL Ratio: 2
Triglycerides: 97 mg/dL (ref 0.0–149.0)
VLDL: 19.4 mg/dL (ref 0.0–40.0)

## 2017-10-16 MED ORDER — OLOPATADINE HCL 0.1 % OP SOLN: 1.0000 [drp] | Freq: Two times a day (BID) | OPHTHALMIC | 12 refills | Status: DC

## 2017-10-16 MED ORDER — AZELASTINE HCL 0.1 % NA SOLN: NASAL | 12 refills | Status: DC

## 2017-10-16 NOTE — Patient Instructions (Signed)
Preventive Care 18-39 Years, Female Preventive care refers to lifestyle choices and visits with your health care provider that can promote health and wellness. What does preventive care include?  A yearly physical exam. This is also called an annual well check.  Dental exams once or twice a year.  Routine eye exams. Ask your health care provider how often you should have your eyes checked.  Personal lifestyle choices, including: ? Daily care of your teeth and gums. ? Regular physical activity. ? Eating a healthy diet. ? Avoiding tobacco and drug use. ? Limiting alcohol use. ? Practicing safe sex. ? Taking vitamin and mineral supplements as recommended by your health care provider. What happens during an annual well check? The services and screenings done by your health care provider during your annual well check will depend on your age, overall health, lifestyle risk factors, and family history of disease. Counseling Your health care provider may ask you questions about your:  Alcohol use.  Tobacco use.  Drug use.  Emotional well-being.  Home and relationship well-being.  Sexual activity.  Eating habits.  Work and work Statistician.  Method of birth control.  Menstrual cycle.  Pregnancy history.  Screening You may have the following tests or measurements:  Height, weight, and BMI.  Diabetes screening. This is done by checking your blood sugar (glucose) after you have not eaten for a while (fasting).  Blood pressure.  Lipid and cholesterol levels. These may be checked every 5 years starting at age 66.  Skin check.  Hepatitis C blood test.  Hepatitis B blood test.  Sexually transmitted disease (STD) testing.  BRCA-related cancer screening. This may be done if you have a family history of breast, ovarian, tubal, or peritoneal cancers.  Pelvic exam and Pap test. This may be done every 3 years starting at age 40. Starting at age 59, this may be done every 5  years if you have a Pap test in combination with an HPV test.  Discuss your test results, treatment options, and if necessary, the need for more tests with your health care provider. Vaccines Your health care provider may recommend certain vaccines, such as:  Influenza vaccine. This is recommended every year.  Tetanus, diphtheria, and acellular pertussis (Tdap, Td) vaccine. You may need a Td booster every 10 years.  Varicella vaccine. You may need this if you have not been vaccinated.  HPV vaccine. If you are 69 or younger, you may need three doses over 6 months.  Measles, mumps, and rubella (MMR) vaccine. You may need at least one dose of MMR. You may also need a second dose.  Pneumococcal 13-valent conjugate (PCV13) vaccine. You may need this if you have certain conditions and were not previously vaccinated.  Pneumococcal polysaccharide (PPSV23) vaccine. You may need one or two doses if you smoke cigarettes or if you have certain conditions.  Meningococcal vaccine. One dose is recommended if you are age 27-21 years and a first-year college student living in a residence hall, or if you have one of several medical conditions. You may also need additional booster doses.  Hepatitis A vaccine. You may need this if you have certain conditions or if you travel or work in places where you may be exposed to hepatitis A.  Hepatitis B vaccine. You may need this if you have certain conditions or if you travel or work in places where you may be exposed to hepatitis B.  Haemophilus influenzae type b (Hib) vaccine. You may need this if  you have certain risk factors.  Talk to your health care provider about which screenings and vaccines you need and how often you need them. This information is not intended to replace advice given to you by your health care provider. Make sure you discuss any questions you have with your health care provider. Document Released: 03/28/2001 Document Revised: 10/20/2015  Document Reviewed: 12/01/2014 Elsevier Interactive Patient Education  [employer].

## 2017-10-16 NOTE — Progress Notes (Signed)
Subjective:  Patient ID: Briana Sanchez, female    DOB: [birth year]  Age: 42 y.o. MRN: [id number]  CC: Annual Exam   HPI Briana Sanchez presents for a CPX.  She complains of itchy watery eyes and sneezing and wants a refill of Patanol and Astelin.  Outpatient Medications Prior to Visit  Medication Sig Dispense Refill  . fexofenadine (ALLEGRA) 180 MG tablet Take 1 tablet (180 mg total) by mouth daily.    Donnetta Hail Estradiol (ORTHO-CYCLEN, 28, PO) Take by mouth.      . ranitidine (ZANTAC) 150 MG tablet TAKE 1 TABLET (150 MG TOTAL) BY MOUTH 2 (TWO) TIMES DAILY. 180 tablet 1  . azelastine (ASTELIN) 137 MCG/SPRAY nasal spray 1-2 sprays each nostril once or twice daily as needed 30 mL 12  . Multiple Vitamin (MULTIVITAMIN) tablet Take 1 tablet by mouth daily.    Marland Kitchen olopatadine (PATANOL) 0.1 % ophthalmic solution Place 1 drop into both eyes 2 (two) times daily. 5 mL 12  . hydrocortisone-pramoxine (PROCTOFOAM-HC) rectal foam Place 1 applicator rectally 2 (two) times daily. 10 g 1   No facility-administered medications prior to visit.     ROS Review of Systems  Constitutional: Negative for fatigue.  HENT: Negative for congestion, facial swelling, postnasal drip, rhinorrhea, sinus pain, sore throat and trouble swallowing.   Eyes: Positive for itching. Negative for discharge and redness.  Respiratory: Negative for cough, chest tightness, shortness of breath and wheezing.   Cardiovascular: Negative for chest pain, palpitations and leg swelling.  Gastrointestinal: Negative for abdominal pain, anal bleeding, constipation, diarrhea, nausea, rectal pain and vomiting.  Endocrine: Negative.   Genitourinary: Negative.  Negative for difficulty urinating.  Musculoskeletal: Negative.  Negative for arthralgias and back pain.  Skin: Negative.  Negative for color change and rash.  Neurological: Negative.  Negative for dizziness, weakness and headaches.  Hematological: Negative for  adenopathy. Does not bruise/bleed easily.  Psychiatric/Behavioral: Negative.     Objective:  BP 100/72   Pulse 62   Temp 97.8 F (36.6 C) (Oral)   Resp 16   Ht 5' 1"  (1.549 m)   Wt 127 lb (57.6 kg)   LMP 09/25/2017 Comment: OCP  SpO2 98%   BMI 24.00 kg/m   BP Readings from Last 3 Encounters:  10/16/17 100/72  08/14/16 100/60  09/13/15 116/68    Wt Readings from Last 3 Encounters:  10/16/17 127 lb (57.6 kg)  08/14/16 122 lb (55.3 kg)  09/13/15 122 lb (55.3 kg)    Physical Exam  Constitutional: She is oriented to person, place, and time. No distress.  HENT:  Mouth/Throat: Oropharynx is clear and moist. No oropharyngeal exudate.  Eyes: Conjunctivae are normal. No scleral icterus.  Neck: Normal range of motion. Neck supple. No JVD present. No thyromegaly present.  Cardiovascular: Normal rate, regular rhythm and normal heart sounds. Exam reveals no gallop.  No murmur heard. Pulmonary/Chest: Effort normal and breath sounds normal. No respiratory distress. She has no wheezes. She has no rales.  Abdominal: Soft. Bowel sounds are normal. She exhibits no mass. There is no hepatosplenomegaly. There is no tenderness.  Musculoskeletal: Normal range of motion. She exhibits no edema, tenderness or deformity.  Lymphadenopathy:    She has no cervical adenopathy.  Neurological: She is alert and oriented to person, place, and time.  Skin: Skin is warm and dry. She is not diaphoretic. No pallor.  Vitals reviewed.   Lab Results  Component Value Date   WBC 9.5 08/14/2016   HGB  14.4 08/14/2016   HCT 42.1 08/14/2016   PLT 264.0 08/14/2016   GLUCOSE 89 08/14/2016   CHOL 179 10/16/2017   TRIG 97.0 10/16/2017   HDL 76.80 10/16/2017   LDLCALC 82 10/16/2017   ALT 10 08/14/2016   AST 15 08/14/2016   NA 137 08/14/2016   K 4.1 08/14/2016   CL 101 08/14/2016   CREATININE 0.74 08/14/2016   BUN 19 08/14/2016   CO2 28 08/14/2016   TSH 0.61 08/14/2016    Mr Cervical Spine Wo  Contrast  Result Date: 09/18/2016 CLINICAL DATA:  Initial evaluation for neck pain with right arm numbness for several months. EXAM: MRI CERVICAL SPINE WITHOUT CONTRAST TECHNIQUE: Multiplanar, multisequence MR imaging of the cervical spine was performed. No intravenous contrast was administered. COMPARISON:  None available. FINDINGS: Alignment: Straightening of the normal cervical lordosis. Trace anterolisthesis of C3 on C4, likely due to facet degeneration. Vertebrae: Vertebral body heights are maintained. No evidence for acute or chronic fracture. Signal intensity within the vertebral body bone marrow is normal. No discrete or worrisome osseous lesions. No abnormal marrow edema. Cord: Signal intensity within the cervical spinal cord is normal. Posterior Fossa, vertebral arteries, paraspinal tissues: Visualized brain and posterior fossa within normal limits. Craniocervical junction normal. Paraspinous and prevertebral soft tissues within normal limits. Normal intravascular flow voids present within the vertebral arteries bilaterally. Disc levels: C2-C3: Unremarkable. C3-C4: Trace anterolisthesis. Mild disc bulge with right-sided uncovertebral hypertrophy. Right-sided facet degeneration. Resultant mild right C4 foraminal stenosis. No canal or left foraminal narrowing. C4-C5:  Unremarkable. C5-C6: Mild disc bulge. No significant canal or foraminal stenosis. C6-C7:  Unremarkable. C7-T1:  Unremarkable. Visualized upper thoracic spine within normal limits. IMPRESSION: 1. Mild disc bulge and right-sided uncovertebral hypertrophy with facet degeneration at C3-4, resulting in mild right C4 foraminal stenosis. 2. Mild disc bulge at C5-6 without stenosis. 3. Otherwise unremarkable MRI of the cervical spine. Electronically Signed   By: Jeannine Boga M.D.   On: 09/18/2016 21:39    Assessment & Plan:   [name] was seen today for annual exam.  Diagnoses and all orders for this visit:  Allergic  rhinoconjunctivitis -     azelastine (ASTELIN) 0.1 % nasal spray; 1-2 sprays each nostril once or twice daily as needed -     olopatadine (PATANOL) 0.1 % ophthalmic solution; Place 1 drop into both eyes 2 (two) times daily.  Routine general medical examination at a health care facility - Exam completed, labs reviewed, she refused a flu vaccine, Pap smear and mammogram are up-to-date, patient education material was given. -     Lipid panel; Future   I have discontinued Aalivia L. Griffith's multivitamin and hydrocortisone-pramoxine. I have also changed her azelastine. Additionally, I am having her maintain her (Norgestimate-Eth Estradiol (ORTHO-CYCLEN, 28, PO)), fexofenadine, ranitidine, and olopatadine.  Meds ordered this encounter  Medications  . azelastine (ASTELIN) 0.1 % nasal spray    Sig: 1-2 sprays each nostril once or twice daily as needed    Dispense:  30 mL    Refill:  12  . olopatadine (PATANOL) 0.1 % ophthalmic solution    Sig: Place 1 drop into both eyes 2 (two) times daily.    Dispense:  5 mL    Refill:  12     Follow-up: Return in about 6 months (around 04/16/2018).  Scarlette Calico, MD

## 2018-10-31 ENCOUNTER — Other Ambulatory Visit: Payer: Self-pay | Admitting: Internal Medicine

## 2018-10-31 DIAGNOSIS — H1010 Acute atopic conjunctivitis, unspecified eye: Secondary | ICD-10-CM

## 2019-01-30 ENCOUNTER — Other Ambulatory Visit: Payer: Self-pay | Admitting: Internal Medicine

## 2019-01-30 DIAGNOSIS — H1010 Acute atopic conjunctivitis, unspecified eye: Secondary | ICD-10-CM

## 2019-09-10 LAB — HM PAP SMEAR

## 2019-12-04 ENCOUNTER — Other Ambulatory Visit: Payer: Self-pay

## 2019-12-04 ENCOUNTER — Ambulatory Visit (INDEPENDENT_AMBULATORY_CARE_PROVIDER_SITE_OTHER): Payer: 59 | Admitting: Internal Medicine

## 2019-12-04 ENCOUNTER — Encounter: Payer: Self-pay | Admitting: Internal Medicine

## 2019-12-04 VITALS — BP 104/68 | HR 65 | Temp 98.3°F | Resp 16 | Ht 61.0 in | Wt 108.0 lb

## 2019-12-04 DIAGNOSIS — Z1159 Encounter for screening for other viral diseases: Secondary | ICD-10-CM | POA: Insufficient documentation

## 2019-12-04 DIAGNOSIS — Z23 Encounter for immunization: Secondary | ICD-10-CM

## 2019-12-04 DIAGNOSIS — Z0000 Encounter for general adult medical examination without abnormal findings: Secondary | ICD-10-CM | POA: Diagnosis not present

## 2019-12-04 DIAGNOSIS — Z1211 Encounter for screening for malignant neoplasm of colon: Secondary | ICD-10-CM | POA: Insufficient documentation

## 2019-12-04 DIAGNOSIS — Z1231 Encounter for screening mammogram for malignant neoplasm of breast: Secondary | ICD-10-CM | POA: Insufficient documentation

## 2019-12-04 DIAGNOSIS — R42 Dizziness and giddiness: Secondary | ICD-10-CM | POA: Diagnosis not present

## 2019-12-04 LAB — CBC WITH DIFFERENTIAL/PLATELET
Basophils Absolute: 0 10*3/uL (ref 0.0–0.1)
Basophils Relative: 0.5 % (ref 0.0–3.0)
Eosinophils Absolute: 0.1 10*3/uL (ref 0.0–0.7)
Eosinophils Relative: 1.5 % (ref 0.0–5.0)
HCT: 43.2 % (ref 36.0–46.0)
Hemoglobin: 14.7 g/dL (ref 12.0–15.0)
Lymphocytes Relative: 34.9 % (ref 12.0–46.0)
Lymphs Abs: 2.5 10*3/uL (ref 0.7–4.0)
MCHC: 34.1 g/dL (ref 30.0–36.0)
MCV: 92.9 fl (ref 78.0–100.0)
Monocytes Absolute: 0.7 10*3/uL (ref 0.1–1.0)
Monocytes Relative: 9.4 % (ref 3.0–12.0)
Neutro Abs: 3.8 10*3/uL (ref 1.4–7.7)
Neutrophils Relative %: 53.7 % (ref 43.0–77.0)
Platelets: 275 10*3/uL (ref 150.0–400.0)
RBC: 4.65 Mil/uL (ref 3.87–5.11)
RDW: 12.2 % (ref 11.5–15.5)
WBC: 7.1 10*3/uL (ref 4.0–10.5)

## 2019-12-04 LAB — HEPATIC FUNCTION PANEL
ALT: 17 U/L (ref 0–35)
AST: 19 U/L (ref 0–37)
Albumin: 4.1 g/dL (ref 3.5–5.2)
Alkaline Phosphatase: 45 U/L (ref 39–117)
Bilirubin, Direct: 0.1 mg/dL (ref 0.0–0.3)
Total Bilirubin: 0.4 mg/dL (ref 0.2–1.2)
Total Protein: 6.9 g/dL (ref 6.0–8.3)

## 2019-12-04 LAB — LIPID PANEL
Cholesterol: 170 mg/dL (ref 0–200)
HDL: 74.3 mg/dL (ref >39.00)
LDL Cholesterol: 80 mg/dL (ref 0–99)
NonHDL: 95.7
Total CHOL/HDL Ratio: 2
Triglycerides: 77 mg/dL (ref 0.0–149.0)
VLDL: 15.4 mg/dL (ref 0.0–40.0)

## 2019-12-04 LAB — BASIC METABOLIC PANEL
BUN: 18 mg/dL (ref 6–23)
CO2: 29 mEq/L (ref 19–32)
Calcium: 9.5 mg/dL (ref 8.4–10.5)
Chloride: 102 mEq/L (ref 96–112)
Creatinine, Ser: 0.79 mg/dL (ref 0.40–1.20)
GFR: 89.59 mL/min (ref >60.00)
Glucose, Bld: 79 mg/dL (ref 70–99)
Potassium: 4.8 mEq/L (ref 3.5–5.1)
Sodium: 137 mEq/L (ref 135–145)

## 2019-12-04 LAB — TSH: TSH: 0.91 u[IU]/mL (ref 0.35–4.50)

## 2019-12-04 NOTE — Patient Instructions (Signed)

## 2019-12-04 NOTE — Progress Notes (Addendum)
Subjective:  Patient ID: Briana Sanchez, female    DOB: 04-29-1975  Age: 44 y.o. MRN: [id number]  CC: Annual Exam and Dizziness  This visit occurred during the SARS-CoV-2 public health emergency.  Safety protocols were in place, including screening questions prior to the visit, additional usage of staff PPE, and extensive cleaning of exam room while observing appropriate contact time as indicated for disinfecting solutions.    HPI Briana Sanchez presents for a CPX.  She complains of a 3-week history of dizzy spells.  She tells me that her spells are triggered by emotional stressors related to her work.  She denies headache, blurred vision, visual or hearing disturbance, slurred speech, ataxia, paresthesias, near-syncope, or syncope.  She has intentionally lost weight with lifestyle modifications.  Outpatient Medications Prior to Visit  Medication Sig Dispense Refill  . Azelastine HCl 137 MCG/SPRAY SOLN USE 1-2 SPRAYS EACH NOSTRIL ONCE OR TWICE DAILY AS NEEDED 90 mL 1  . fexofenadine (ALLEGRA) 180 MG tablet Take 1 tablet (180 mg total) by mouth daily.    Marland Kitchen olopatadine (PATANOL) 0.1 % ophthalmic solution Place 1 drop into both eyes 2 (two) times daily. 5 mL 12  . Norgestimate-Eth Estradiol (ORTHO-CYCLEN, 28, PO) Take by mouth.      . ranitidine (ZANTAC) 150 MG tablet TAKE 1 TABLET (150 MG TOTAL) BY MOUTH 2 (TWO) TIMES DAILY. 180 tablet 1  . norethindrone-ethinyl estradiol (JUNEL FE 1/20) 1-20 MG-MCG tablet Take 1 tablet by mouth daily. 28 tablet 11   No facility-administered medications prior to visit.    ROS Review of Systems  Constitutional: Negative for appetite change, diaphoresis, fatigue and unexpected weight change.  HENT: Negative.   Eyes: Negative for visual disturbance.  Respiratory: Negative for cough, chest tightness, shortness of breath and wheezing.   Cardiovascular: Negative for chest pain, palpitations and leg swelling.  Gastrointestinal: Negative for abdominal  pain, constipation, diarrhea, nausea and vomiting.  Endocrine: Negative.   Genitourinary: Negative.  Negative for difficulty urinating.  Musculoskeletal: Negative for arthralgias and myalgias.  Skin: Negative.  Negative for color change and pallor.  Neurological: Positive for dizziness. Negative for tremors, seizures, syncope, facial asymmetry, speech difficulty, weakness, light-headedness, numbness and headaches.  Hematological: Negative for adenopathy. Does not bruise/bleed easily.  Psychiatric/Behavioral: Negative for decreased concentration, dysphoric mood, self-injury and suicidal ideas. The patient is nervous/anxious.     Objective:  BP 104/68   Pulse 65   Temp 98.3 F (36.8 C) (Oral)   Resp 16   Ht 5' 1"  (1.549 m)   Wt 108 lb (49 kg)   SpO2 97%   BMI 20.41 kg/m   BP Readings from Last 3 Encounters:  12/04/19 104/68  10/16/17 100/72  08/14/16 100/60    Wt Readings from Last 3 Encounters:  12/04/19 108 lb (49 kg)  10/16/17 127 lb (57.6 kg)  08/14/16 122 lb (55.3 kg)    Physical Exam Vitals reviewed.  Constitutional:      Appearance: Normal appearance.  HENT:     Nose: Nose normal.     Mouth/Throat:     Mouth: Mucous membranes are moist.  Eyes:     General: No scleral icterus.    Extraocular Movements: Extraocular movements intact.     Conjunctiva/sclera: Conjunctivae normal.     Pupils: Pupils are equal, round, and reactive to light.  Cardiovascular:     Rate and Rhythm: Normal rate and regular rhythm.     Heart sounds: No murmur heard.   Pulmonary:  Effort: Pulmonary effort is normal.     Breath sounds: No stridor. No wheezing, rhonchi or rales.  Abdominal:     General: Abdomen is flat. Bowel sounds are normal. There is no distension.     Palpations: Abdomen is soft. There is no hepatomegaly, splenomegaly or mass.     Tenderness: There is no abdominal tenderness.     Hernia: No hernia is present.  Musculoskeletal:        General: Normal range of  motion.     Cervical back: Neck supple.     Right lower leg: No edema.     Left lower leg: No edema.  Lymphadenopathy:     Cervical: No cervical adenopathy.  Skin:    General: Skin is warm and dry.     Coloration: Skin is not pale.  Neurological:     Mental Status: She is alert.     Cranial Nerves: No cranial nerve deficit, dysarthria or facial asymmetry.     Sensory: Sensation is intact.     Motor: Motor function is intact. No weakness, atrophy or abnormal muscle tone.     Coordination: Coordination is intact. Romberg sign negative. Coordination normal. Finger-Nose-Finger Test and Heel to [facility] Test normal. Rapid alternating movements normal.     Deep Tendon Reflexes: Reflexes normal. Babinski sign absent on the right side. Babinski sign absent on the left side.     Reflex Scores:      Tricep reflexes are 1+ on the right side and 1+ on the left side.      Bicep reflexes are 1+ on the right side and 1+ on the left side.      Brachioradialis reflexes are 1+ on the right side and 1+ on the left side.      Patellar reflexes are 1+ on the right side and 1+ on the left side.      Achilles reflexes are 1+ on the right side and 1+ on the left side. Psychiatric:        Mood and Affect: Mood normal.        Behavior: Behavior normal.        Thought Content: Thought content normal.        Judgment: Judgment normal.     Lab Results  Component Value Date   WBC 7.1 12/04/2019   HGB 14.7 12/04/2019   HCT 43.2 12/04/2019   PLT 275.0 12/04/2019   GLUCOSE 79 12/04/2019   CHOL 170 12/04/2019   TRIG 77.0 12/04/2019   HDL 74.30 12/04/2019   LDLCALC 80 12/04/2019   ALT 17 12/04/2019   AST 19 12/04/2019   NA 137 12/04/2019   K 4.8 12/04/2019   CL 102 12/04/2019   CREATININE 0.79 12/04/2019   BUN 18 12/04/2019   CO2 29 12/04/2019   TSH 0.91 12/04/2019    MR Cervical Spine Wo Contrast  Result Date: 09/18/2016 CLINICAL DATA:  Initial evaluation for neck pain with right arm numbness for  several months. EXAM: MRI CERVICAL SPINE WITHOUT CONTRAST TECHNIQUE: Multiplanar, multisequence MR imaging of the cervical spine was performed. No intravenous contrast was administered. COMPARISON:  None available. FINDINGS: Alignment: Straightening of the normal cervical lordosis. Trace anterolisthesis of C3 on C4, likely due to facet degeneration. Vertebrae: Vertebral body heights are maintained. No evidence for acute or chronic fracture. Signal intensity within the vertebral body bone marrow is normal. No discrete or worrisome osseous lesions. No abnormal marrow edema. Cord: Signal intensity within the cervical spinal cord is normal.  Posterior Fossa, vertebral arteries, paraspinal tissues: Visualized brain and posterior fossa within normal limits. Craniocervical junction normal. Paraspinous and prevertebral soft tissues within normal limits. Normal intravascular flow voids present within the vertebral arteries bilaterally. Disc levels: C2-C3: Unremarkable. C3-C4: Trace anterolisthesis. Mild disc bulge with right-sided uncovertebral hypertrophy. Right-sided facet degeneration. Resultant mild right C4 foraminal stenosis. No canal or left foraminal narrowing. C4-C5:  Unremarkable. C5-C6: Mild disc bulge. No significant canal or foraminal stenosis. C6-C7:  Unremarkable. C7-T1:  Unremarkable. Visualized upper thoracic spine within normal limits. IMPRESSION: 1. Mild disc bulge and right-sided uncovertebral hypertrophy with facet degeneration at C3-4, resulting in mild right C4 foraminal stenosis. 2. Mild disc bulge at C5-6 without stenosis. 3. Otherwise unremarkable MRI of the cervical spine. Electronically Signed   By: Jeannine Boga M.D.   On: 09/18/2016 21:39   NCV with EMG(electromyography)  Result Date: 09/19/2016 Alda Berthold, DO     09/19/2016  9:33 AM [facility] [place]  [place], [place] [address] Tel: [phone] Fax:  [phone] Test Date:  09/19/2016 Patient:  Chyla Schlender DOB: [birth year] Physician: Narda Amber, DO Sex: Female Height: 5' 1"  Ref Phys: Scarlette Calico, M.D. ID#: [id number] Temp: 34.0C Technician:  Patient Complaints: This is a 44 year-old female referred for evaluation of neck pain and left arm tingling. NCV & EMG Findings: Extensive electrodiagnostic testing of the left upper extremity shows: 1. Left median, ulnar, and mixed palmer sensory responses are within normal limits. 2. Left median and ulnar motor responses are within normal limits. 3. There is no evidence of active or chronic motor axon loss changes affecting any of the tested muscles. Motor unit configuration and recruitment pattern is within normal limits. Impression: This is a normal study of the left upper extremity. In particular, there is no evidence of a cervical radiculopathy or carpal tunnel syndrome. ___________________________ Narda Amber, DO Nerve Conduction Studies Anti Sensory Summary Table  Site NR Peak (ms) Norm Peak (ms) P-T Amp (V) Norm P-T Amp Left Median Anti Sensory (2nd Digit)  34C Wrist    3.3 <3.4 105.4 >20 Left Ulnar Anti Sensory (5th Digit)  34C Wrist    2.7 <3.1 96.0 >12 Motor Summary Table  Site NR Onset (ms) Norm Onset (ms) O-P Amp (mV) Norm O-P Amp Site1 Site2 Delta-0 (ms) Dist (cm) Vel (m/s) Norm Vel (m/s) Left Median Motor (Abd Poll Brev)  34C Wrist    3.0 <3.9 9.6 >6 Elbow Wrist 4.3 24.5 57 >50 Elbow    7.3  9.4        Left Ulnar Motor (Abd Dig Minimi)  34C Wrist    2.3 <3.1 8.0 >7 B Elbow Wrist 3.1 20.0 65 >50 B Elbow    5.4  7.9  A Elbow B Elbow 1.7 10.0 59 >50 A Elbow    7.1  7.9        Comparison Summary Table  Site NR Peak (ms) Norm Peak (ms) P-T Amp (V) Site1 Site2 Delta-P (ms) Norm Delta (ms) Left Median/Ulnar Palm Comparison (Wrist - 8cm)  34C Median Palm    1.8 <2.2 95.0 Median Palm Ulnar Palm 0.3  Ulnar Palm    1.5 <2.2 30.3     EMG  Side Muscle Ins Act Fibs Psw Fasc Number Recrt Dur Dur. Amp Amp. Poly Poly. Comment Left 1stDorInt Nml Nml Nml Nml  Nml Nml Nml Nml Nml Nml Nml Nml N/A Left Ext Indicis Nml Nml Nml Nml Nml Nml Nml Nml Nml Nml Nml  Nml N/A Left PronatorTeres Nml Nml Nml Nml Nml Nml Nml Nml Nml Nml Nml Nml N/A Left Biceps Nml Nml Nml Nml Nml Nml Nml Nml Nml Nml Nml Nml N/A Left Triceps Nml Nml Nml Nml Nml Nml Nml Nml Nml Nml Nml Nml N/A Left Cervical Parasp Low Nml Nml Nml Nml NE - - - - - - - N/A Waveforms:         Assessment & Plan:   Latania was seen today for annual exam and dizziness.  Diagnoses and all orders for this visit:  Routine general medical examination at a health care facility- Exam completed, labs reviewed, vaccines reviewed and updated, cancer screenings addressed, patient education was given. -     Lipid panel; Future -     Lipid panel  Dizziness, nonspecific- She complains of dizziness but no other neurological or cardiovascular symptoms.  Her examination and labs are reassuring.  I think this is triggered by emotional stressors.  If this does not resolve soon then will consider ordering an MRI of her brain to screen for demyelinating process.  She does not feel like her symptoms are severe enough to need to take an antidepressant. -     CBC with Differential/Platelet; Future -     Basic metabolic panel; Future -     TSH; Future -     Hepatic function panel; Future -     Hepatic function panel -     TSH -     Basic metabolic panel -     CBC with Differential/Platelet  Need for hepatitis C screening test -     Lipid panel; Future -     Hepatitis C antibody; Future -     Hepatitis C antibody -     Lipid panel  Colon cancer screening -     Cologuard  Visit for screening mammogram -     MM DIGITAL SCREENING BILATERAL; Future  Other orders -     Flu Vaccine QUAD 6+ mos PF IM (Fluarix Quad PF)   I have discontinued Lazaria L. Andersson's (Norgestimate-Eth Estradiol (ORTHO-CYCLEN, 28, PO)) and ranitidine. I am also having her maintain her fexofenadine, olopatadine, Azelastine HCl, and  norethindrone-ethinyl estradiol.  No orders of the defined types were placed in this encounter.    Follow-up: Return in about 3 weeks (around 12/25/2019).  Scarlette Calico, MD

## 2019-12-08 ENCOUNTER — Encounter: Payer: Self-pay | Admitting: Internal Medicine

## 2019-12-08 LAB — HEPATITIS C ANTIBODY
Hepatitis C Ab: NONREACTIVE
SIGNAL TO CUT-OFF: 0 (ref <1.00)

## 2019-12-19 LAB — COLOGUARD: Cologuard: NEGATIVE

## 2019-12-31 LAB — COLOGUARD: COLOGUARD: NEGATIVE

## 2019-12-31 LAB — EXTERNAL GENERIC LAB PROCEDURE: COLOGUARD: NEGATIVE

## 2020-01-01 ENCOUNTER — Encounter: Payer: Self-pay | Admitting: Internal Medicine

## 2021-05-19 ENCOUNTER — Encounter: Payer: 59 | Admitting: Internal Medicine

## 2021-07-04 ENCOUNTER — Encounter: Payer: Self-pay | Admitting: Internal Medicine

## 2021-07-04 ENCOUNTER — Ambulatory Visit (INDEPENDENT_AMBULATORY_CARE_PROVIDER_SITE_OTHER): Payer: 59 | Admitting: Internal Medicine

## 2021-07-04 ENCOUNTER — Ambulatory Visit (INDEPENDENT_AMBULATORY_CARE_PROVIDER_SITE_OTHER): Payer: 59

## 2021-07-04 VITALS — BP 98/64 | HR 77 | Temp 97.8°F | Ht 61.0 in | Wt 110.0 lb

## 2021-07-04 DIAGNOSIS — Z0000 Encounter for general adult medical examination without abnormal findings: Secondary | ICD-10-CM

## 2021-07-04 DIAGNOSIS — M25561 Pain in right knee: Secondary | ICD-10-CM | POA: Diagnosis not present

## 2021-07-04 LAB — CBC WITH DIFFERENTIAL/PLATELET
Basophils Absolute: 0 10*3/uL (ref 0.0–0.1)
Basophils Relative: 0.5 % (ref 0.0–3.0)
Eosinophils Absolute: 0.2 10*3/uL (ref 0.0–0.7)
Eosinophils Relative: 2.9 % (ref 0.0–5.0)
HCT: 43.3 % (ref 36.0–46.0)
Hemoglobin: 14.9 g/dL (ref 12.0–15.0)
Lymphocytes Relative: 42.1 % (ref 12.0–46.0)
Lymphs Abs: 2.8 10*3/uL (ref 0.7–4.0)
MCHC: 34.5 g/dL (ref 30.0–36.0)
MCV: 93.9 fl (ref 78.0–100.0)
Monocytes Absolute: 0.6 10*3/uL (ref 0.1–1.0)
Monocytes Relative: 9.3 % (ref 3.0–12.0)
Neutro Abs: 3 10*3/uL (ref 1.4–7.7)
Neutrophils Relative %: 45.2 % (ref 43.0–77.0)
Platelets: 263 10*3/uL (ref 150.0–400.0)
RBC: 4.61 Mil/uL (ref 3.87–5.11)
RDW: 12.2 % (ref 11.5–15.5)
WBC: 6.6 10*3/uL (ref 4.0–10.5)

## 2021-07-04 LAB — C-REACTIVE PROTEIN: CRP: <1 mg/dL (ref 0.5–20.0)

## 2021-07-04 NOTE — Patient Instructions (Signed)
Acute Knee Pain, Adult Acute knee pain is sudden and may be caused by damage, swelling, or irritation of the muscles and tissues that support the knee. Pain may result from: A fall. An injury to the knee from twisting motions. A hit to the knee. Infection. Acute knee pain may go away on its own with time and rest. If it does not, your health care provider may order tests to find the cause of the pain. These may include: Imaging tests, such as an X-ray, MRI, CT scan, or ultrasound. Joint aspiration. In this test, fluid is removed from the knee and evaluated. Arthroscopy. In this test, a lighted tube is inserted into the knee and an image is projected onto a TV screen. Biopsy. In this test, a sample of tissue is removed from the body and studied under a microscope. Follow these instructions at home: If you have a knee sleeve or brace:  Wear the knee sleeve or brace as told by your health care provider. Remove it only as told by your health care provider. Loosen it if your toes tingle, become numb, or turn cold and blue. Keep it clean. If the knee sleeve or brace is not waterproof: Do not let it get wet. Cover it with a watertight covering when you take a bath or shower. Activity Rest your knee. Do not do things that cause pain or make pain worse. Avoid high-impact activities or exercises, such as running, jumping rope, or doing jumping jacks. Work with a physical therapist to make a safe exercise program, as recommended by your health care provider. Do exercises as told by your physical therapist. Managing pain, stiffness, and swelling  If directed, put ice on the affected knee. To do this: If you have a removable knee sleeve or brace, remove it as told by your health care provider. Put ice in a plastic bag. Place a towel between your skin and the bag. Leave the ice on for 20 minutes, 2-3 times a day. Remove the ice if your skin turns bright red. This is very important. If you cannot  feel pain, heat, or cold, you have a greater risk of damage to the area. If directed, use an elastic bandage to put pressure (compression) on your injured knee. This may control swelling, give support, and help with discomfort. Raise (elevate) your knee above the level of your heart while you are sitting or lying down. Sleep with a pillow under your knee. General instructions Take over-the-counter and prescription medicines only as told by your health care provider. Do not use any products that contain nicotine or tobacco, such as cigarettes, e-cigarettes, and chewing tobacco. If you need help quitting, ask your health care provider. If you are overweight, work with your health care provider and a dietitian to set a weight-loss goal that is healthy and reasonable for you. Extra weight can put pressure on your knee. Pay attention to any changes in your symptoms. Keep all follow-up visits. This is important. Contact a health care provider if: Your knee pain continues, changes, or gets worse. You have a fever along with knee pain. Your knee feels warm to the touch or is red. Your knee buckles or locks up. Get help right away if: Your knee swells, and the swelling becomes worse. You cannot move your knee. You have severe pain in your knee that cannot be managed with pain medicine. Summary Acute knee pain can be caused by a fall, an injury, an infection, or damage, swelling, or irritation  of the tissues that support your knee. Your health care provider may perform tests to find out the cause of the pain. Pay attention to any changes in your symptoms. Relieve your pain with rest, medicines, light activity, and the use of ice. Get help right away if your knee swells, you cannot move your knee, or you have severe pain that cannot be managed with medicine. This information is not intended to replace advice given to you by your health care provider. Make sure you discuss any questions you have with  your health care provider. Document Revised: 07/16/2019 Document Reviewed: 07/16/2019 Elsevier Patient Education  [place].

## 2021-07-04 NOTE — Progress Notes (Signed)
Subjective:  Patient ID: Briana Sanchez, female    DOB: Jun 01, 1975  Age: 46 y.o. MRN: [id number]  CC: Annual Exam   HPI AVEN CEGIELSKI presents for a CPX and f/up -  She complains of a 1 month history of nontraumatic right knee pain and swelling.  She is controlling the pain with intermittent dosing of ibuprofen.  She tells me that none of her other joints bother her.  Outpatient Medications Prior to Visit  Medication Sig Dispense Refill   Azelastine HCl 137 MCG/SPRAY SOLN USE 1-2 SPRAYS EACH NOSTRIL ONCE OR TWICE DAILY AS NEEDED 90 mL 1   fexofenadine (ALLEGRA) 180 MG tablet Take 1 tablet (180 mg total) by mouth daily.     norethindrone-ethinyl estradiol-FE (LOESTRIN FE) 1-20 MG-MCG tablet Take 1 tablet by mouth daily. 28 tablet 11   olopatadine (PATANOL) 0.1 % ophthalmic solution Place 1 drop into both eyes 2 (two) times daily. 5 mL 12   No facility-administered medications prior to visit.    ROS Review of Systems  Constitutional: Negative.  Negative for chills, fatigue and fever.  HENT: Negative.    Eyes: Negative.   Respiratory:  Negative for cough, chest tightness, shortness of breath and wheezing.   Cardiovascular:  Negative for chest pain, palpitations and leg swelling.  Gastrointestinal:  Negative for abdominal pain, constipation, diarrhea, nausea and vomiting.  Endocrine: Negative.   Genitourinary: Negative.  Negative for difficulty urinating.  Musculoskeletal:  Positive for arthralgias. Negative for back pain and myalgias.  Neurological:  Negative for dizziness, weakness, light-headedness, numbness and headaches.  Hematological:  Negative for adenopathy. Does not bruise/bleed easily.  Psychiatric/Behavioral: Negative.     Objective:  BP 98/64 (BP Location: Right Arm, Patient Position: Sitting, Cuff Size: Normal)   Pulse 77   Temp 97.8 F (36.6 C) (Oral)   Ht 5' 1"  (1.549 m)   Wt 110 lb (49.9 kg)   SpO2 98%   BMI 20.78 kg/m   BP Readings from Last 3  Encounters:  07/04/21 98/64  12/04/19 104/68  10/16/17 100/72    Wt Readings from Last 3 Encounters:  07/04/21 110 lb (49.9 kg)  12/04/19 108 lb (49 kg)  10/16/17 127 lb (57.6 kg)    Physical Exam Vitals reviewed.  HENT:     Nose: Nose normal.     Mouth/Throat:     Mouth: Mucous membranes are moist.  Eyes:     General: No scleral icterus.    Conjunctiva/sclera: Conjunctivae normal.  Cardiovascular:     Rate and Rhythm: Normal rate and regular rhythm.     Heart sounds: No murmur heard. Pulmonary:     Effort: Pulmonary effort is normal.     Breath sounds: No stridor. No wheezing, rhonchi or rales.  Abdominal:     General: Abdomen is flat.     Palpations: There is no mass.     Tenderness: There is no abdominal tenderness. There is no guarding.     Hernia: No hernia is present.  Musculoskeletal:        General: No swelling or deformity. Normal range of motion.     Cervical back: Neck supple.     Right knee: Normal. No swelling, deformity, effusion, bony tenderness or crepitus. Normal range of motion.     Left knee: Normal.     Right lower leg: No edema.     Left lower leg: No edema.  Lymphadenopathy:     Cervical: No cervical adenopathy.  Skin:  General: Skin is warm and dry.  Neurological:     General: No focal deficit present.     Mental Status: She is alert.  Psychiatric:        Mood and Affect: Mood normal.    Lab Results  Component Value Date   WBC 6.6 07/04/2021   HGB 14.9 07/04/2021   HCT 43.3 07/04/2021   PLT 263.0 07/04/2021   GLUCOSE 79 12/04/2019   CHOL 170 12/04/2019   TRIG 77.0 12/04/2019   HDL 74.30 12/04/2019   LDLCALC 80 12/04/2019   ALT 17 12/04/2019   AST 19 12/04/2019   NA 137 12/04/2019   K 4.8 12/04/2019   CL 102 12/04/2019   CREATININE 0.79 12/04/2019   BUN 18 12/04/2019   CO2 29 12/04/2019   TSH 0.91 12/04/2019    MR Cervical Spine Wo Contrast  Result Date: 09/18/2016 CLINICAL DATA:  Initial evaluation for neck pain with  right arm numbness for several months. EXAM: MRI CERVICAL SPINE WITHOUT CONTRAST TECHNIQUE: Multiplanar, multisequence MR imaging of the cervical spine was performed. No intravenous contrast was administered. COMPARISON:  None available. FINDINGS: Alignment: Straightening of the normal cervical lordosis. Trace anterolisthesis of C3 on C4, likely due to facet degeneration. Vertebrae: Vertebral body heights are maintained. No evidence for acute or chronic fracture. Signal intensity within the vertebral body bone marrow is normal. No discrete or worrisome osseous lesions. No abnormal marrow edema. Cord: Signal intensity within the cervical spinal cord is normal. Posterior Fossa, vertebral arteries, paraspinal tissues: Visualized brain and posterior fossa within normal limits. Craniocervical junction normal. Paraspinous and prevertebral soft tissues within normal limits. Normal intravascular flow voids present within the vertebral arteries bilaterally. Disc levels: C2-C3: Unremarkable. C3-C4: Trace anterolisthesis. Mild disc bulge with right-sided uncovertebral hypertrophy. Right-sided facet degeneration. Resultant mild right C4 foraminal stenosis. No canal or left foraminal narrowing. C4-C5:  Unremarkable. C5-C6: Mild disc bulge. No significant canal or foraminal stenosis. C6-C7:  Unremarkable. C7-T1:  Unremarkable. Visualized upper thoracic spine within normal limits. IMPRESSION: 1. Mild disc bulge and right-sided uncovertebral hypertrophy with facet degeneration at C3-4, resulting in mild right C4 foraminal stenosis. 2. Mild disc bulge at C5-6 without stenosis. 3. Otherwise unremarkable MRI of the cervical spine. Electronically Signed   By: Jeannine Boga M.D.   On: 09/18/2016 21:39   NCV with EMG(electromyography)  Result Date: 09/19/2016 Alda Berthold, DO     09/19/2016  9:33 AM [facility] [place], [place]  [place], [place] [address] Tel: [phone] Fax:  [phone] Test Date:   09/19/2016 Patient: Briana Sanchez DOB: 1975-08-01 Physician: Narda Amber, DO Sex: Female Height: 5' 1"  Ref Phys: Scarlette Calico, M.D. ID#: [id number] Temp: 34.0C Technician:  Patient Complaints: This is a 46 year-old female referred for evaluation of neck pain and left arm tingling. NCV & EMG Findings: Extensive electrodiagnostic testing of the left upper extremity shows: 1. Left median, ulnar, and mixed palmer sensory responses are within normal limits. 2. Left median and ulnar motor responses are within normal limits. 3. There is no evidence of active or chronic motor axon loss changes affecting any of the tested muscles. Motor unit configuration and recruitment pattern is within normal limits. Impression: This is a normal study of the left upper extremity. In particular, there is no evidence of a cervical radiculopathy or carpal tunnel syndrome. ___________________________ Narda Amber, DO Nerve Conduction Studies Anti Sensory Summary Table  Site NR Peak (ms) Norm Peak (ms) P-T Amp (V) Norm P-T Amp  Left Median Anti Sensory (2nd Digit)  34C Wrist    3.3 <3.4 105.4 >20 Left Ulnar Anti Sensory (5th Digit)  34C Wrist    2.7 <3.1 96.0 >12 Motor Summary Table  Site NR Onset (ms) Norm Onset (ms) O-P Amp (mV) Norm O-P Amp Site1 Site2 Delta-0 (ms) Dist (cm) Vel (m/s) Norm Vel (m/s) Left Median Motor (Abd Poll Brev)  34C Wrist    3.0 <3.9 9.6 >6 Elbow Wrist 4.3 24.5 57 >50 Elbow    7.3  9.4        Left Ulnar Motor (Abd Dig Minimi)  34C Wrist    2.3 <3.1 8.0 >7 B Elbow Wrist 3.1 20.0 65 >50 B Elbow    5.4  7.9  A Elbow B Elbow 1.7 10.0 59 >50 A Elbow    7.1  7.9        Comparison Summary Table  Site NR Peak (ms) Norm Peak (ms) P-T Amp (V) Site1 Site2 Delta-P (ms) Norm Delta (ms) Left Median/Ulnar Palm Comparison (Wrist - 8cm)  34C Median Palm    1.8 <2.2 95.0 Median Palm Ulnar Palm 0.3  Ulnar Palm    1.5 <2.2 30.3     EMG  Side Muscle Ins Act Fibs Psw Fasc Number Recrt Dur Dur. Amp Amp. Poly Poly. Comment Left  1stDorInt Nml Nml Nml Nml Nml Nml Nml Nml Nml Nml Nml Nml N/A Left Ext Indicis Nml Nml Nml Nml Nml Nml Nml Nml Nml Nml Nml Nml N/A Left PronatorTeres Nml Nml Nml Nml Nml Nml Nml Nml Nml Nml Nml Nml N/A Left Biceps Nml Nml Nml Nml Nml Nml Nml Nml Nml Nml Nml Nml N/A Left Triceps Nml Nml Nml Nml Nml Nml Nml Nml Nml Nml Nml Nml N/A Left Cervical Parasp Low Nml Nml Nml Nml NE - - - - - - - N/A Waveforms:        DG Knee Complete 4 Views Right  Result Date: 07/04/2021 CLINICAL DATA:  Right knee pain for 1 month. EXAM: RIGHT KNEE - COMPLETE 4+ VIEW COMPARISON:  None Available. FINDINGS: Normal bone mineralization. Joint spaces are preserved. No acute fracture is seen. No dislocation. No knee joint effusion. IMPRESSION: Normal right knee radiographs. Electronically Signed   By: Yvonne Kendall M.D.   On: 07/04/2021 10:40     Assessment & Plan:   Wyonia was seen today for annual exam.  Diagnoses and all orders for this visit:  Acute pain of right knee- Exam and plain films are normal.  Labs are negative for Lyme disease or inflammatory arthropathy.  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  She will continue taking the anti-inflammatory as needed. -     DG Knee Complete 4 Views Right; Future -     CBC with Differential/Platelet; Future -     B. burgdorfi antibodies by WB; Future -     C-reactive protein; Future -     C-reactive protein -     B. burgdorfi antibodies by WB -     CBC with Differential/Platelet  Routine general medical examination at a health care facility- Exam completed, labs reviewed, vaccines are up-to-date, cancer screenings are up-to-date, patient education was given.   I am having Mouna L. Neyhart maintain her fexofenadine, olopatadine, Azelastine HCl, and norethindrone-ethinyl estradiol-FE.  No orders of the defined types were placed in this encounter.    Follow-up: Return if symptoms worsen or fail to improve.  Scarlette Calico, MD

## 2021-07-07 LAB — B. BURGDORFI ANTIBODIES BY WB
B burgdorferi IgG Abs (IB): NEGATIVE
B burgdorferi IgM Abs (IB): NEGATIVE
Lyme Disease 18 kD IgG: NONREACTIVE
Lyme Disease 23 kD IgG: NONREACTIVE
Lyme Disease 23 kD IgM: NONREACTIVE
Lyme Disease 28 kD IgG: NONREACTIVE
Lyme Disease 30 kD IgG: NONREACTIVE
Lyme Disease 39 kD IgG: NONREACTIVE
Lyme Disease 39 kD IgM: NONREACTIVE
Lyme Disease 41 kD IgG: REACTIVE — AB
Lyme Disease 41 kD IgM: NONREACTIVE
Lyme Disease 45 kD IgG: NONREACTIVE
Lyme Disease 58 kD IgG: REACTIVE — AB
Lyme Disease 66 kD IgG: NONREACTIVE
Lyme Disease 93 kD IgG: NONREACTIVE

## 2021-11-15 ENCOUNTER — Encounter: Payer: Self-pay | Admitting: Internal Medicine

## 2022-02-22 LAB — HM MAMMOGRAPHY: HM Mammogram: NORMAL (ref 0–4)

## 2022-02-22 LAB — HM PAP SMEAR

## 2022-11-21 ENCOUNTER — Encounter: Payer: Self-pay | Admitting: Internal Medicine

## 2022-11-21 ENCOUNTER — Other Ambulatory Visit: Payer: Self-pay | Admitting: Internal Medicine

## 2022-11-21 DIAGNOSIS — Z1211 Encounter for screening for malignant neoplasm of colon: Secondary | ICD-10-CM

## 2022-11-22 ENCOUNTER — Encounter: Payer: Self-pay | Admitting: Gastroenterology

## 2022-12-22 ENCOUNTER — Ambulatory Visit (AMBULATORY_SURGERY_CENTER): Payer: 59

## 2022-12-22 VITALS — Ht 61.0 in | Wt 111.0 lb

## 2022-12-22 DIAGNOSIS — Z1211 Encounter for screening for malignant neoplasm of colon: Secondary | ICD-10-CM

## 2022-12-22 MED ORDER — NA SULFATE-K SULFATE-MG SULF 17.5-3.13-1.6 GM/177ML PO SOLN: 1.0000 | Freq: Once | ORAL | 0 refills | Status: AC

## 2022-12-22 NOTE — Progress Notes (Signed)

## 2023-01-02 ENCOUNTER — Encounter: Payer: Self-pay | Admitting: Plastic Surgery

## 2023-01-02 ENCOUNTER — Encounter: Payer: Self-pay | Admitting: Gastroenterology

## 2023-01-08 ENCOUNTER — Ambulatory Visit: Payer: 59 | Admitting: Internal Medicine

## 2023-01-08 ENCOUNTER — Encounter: Payer: Self-pay | Admitting: Internal Medicine

## 2023-01-08 VITALS — BP 96/76 | HR 74 | Temp 97.8°F | Ht 61.0 in | Wt 114.2 lb

## 2023-01-08 DIAGNOSIS — Z1211 Encounter for screening for malignant neoplasm of colon: Secondary | ICD-10-CM

## 2023-01-08 DIAGNOSIS — I950 Idiopathic hypotension: Secondary | ICD-10-CM | POA: Diagnosis not present

## 2023-01-08 DIAGNOSIS — Z0000 Encounter for general adult medical examination without abnormal findings: Secondary | ICD-10-CM

## 2023-01-08 LAB — LIPID PANEL
Cholesterol: 201 mg/dL — ABNORMAL HIGH (ref 0–200)
HDL: 89.3 mg/dL (ref >39.00)
LDL Cholesterol: 98 mg/dL (ref 0–99)
NonHDL: 111.88
Total CHOL/HDL Ratio: 2
Triglycerides: 69 mg/dL (ref 0.0–149.0)
VLDL: 13.8 mg/dL (ref 0.0–40.0)

## 2023-01-08 LAB — CBC WITH DIFFERENTIAL/PLATELET
Basophils Absolute: 0.1 10*3/uL (ref 0.0–0.1)
Basophils Relative: 0.8 % (ref 0.0–3.0)
Eosinophils Absolute: 0.2 10*3/uL (ref 0.0–0.7)
Eosinophils Relative: 3 % (ref 0.0–5.0)
HCT: 45.2 % (ref 36.0–46.0)
Hemoglobin: 15 g/dL (ref 12.0–15.0)
Lymphocytes Relative: 33.6 % (ref 12.0–46.0)
Lymphs Abs: 2.4 10*3/uL (ref 0.7–4.0)
MCHC: 33.1 g/dL (ref 30.0–36.0)
MCV: 95.3 fL (ref 78.0–100.0)
Monocytes Absolute: 0.7 10*3/uL (ref 0.1–1.0)
Monocytes Relative: 10.4 % (ref 3.0–12.0)
Neutro Abs: 3.7 10*3/uL (ref 1.4–7.7)
Neutrophils Relative %: 52.2 % (ref 43.0–77.0)
Platelets: 299 10*3/uL (ref 150.0–400.0)
RBC: 4.74 Mil/uL (ref 3.87–5.11)
RDW: 12.6 % (ref 11.5–15.5)
WBC: 7.1 10*3/uL (ref 4.0–10.5)

## 2023-01-08 LAB — BASIC METABOLIC PANEL WITH GFR
BUN: 22 mg/dL (ref 6–23)
CO2: 26 meq/L (ref 19–32)
Calcium: 9.4 mg/dL (ref 8.4–10.5)
Chloride: 102 meq/L (ref 96–112)
Creatinine, Ser: 0.86 mg/dL (ref 0.40–1.20)
GFR: 79.17 mL/min
Glucose, Bld: 85 mg/dL (ref 70–99)
Potassium: 4.7 meq/L (ref 3.5–5.1)
Sodium: 136 meq/L (ref 135–145)

## 2023-01-08 LAB — HEPATIC FUNCTION PANEL
ALT: 14 U/L (ref 0–35)
AST: 18 U/L (ref 0–37)
Albumin: 4.1 g/dL (ref 3.5–5.2)
Alkaline Phosphatase: 52 U/L (ref 39–117)
Bilirubin, Direct: 0.1 mg/dL (ref 0.0–0.3)
Total Bilirubin: 0.5 mg/dL (ref 0.2–1.2)
Total Protein: 7.1 g/dL (ref 6.0–8.3)

## 2023-01-08 LAB — TSH: TSH: 0.4 u[IU]/mL (ref 0.35–5.50)

## 2023-01-08 NOTE — Progress Notes (Unsigned)
Subjective:  Patient ID: Briana Sanchez, female    DOB: 06/11/1975  Age: 47 y.o. MRN: [id number]  CC: Annual Exam   HPI KEMA REDING presents for a CPX and f/up -----  Discussed the use of AI scribe software for clinical note transcription with the patient, who gave verbal consent to proceed.  History of Present Illness   The patient, with a history of regular pap smears and mammograms, reports feeling well with only minor, manageable aches and pains.   The patient is currently on a contraceptive pill that suppresses her menstrual cycles. She reports no issues with low blood pressure, such as dizziness or lightheadedness, and notes that her blood pressure has always been on the lower side.  She has a family history of high cholesterol, with her mother having had high cholesterol levels intermittently, but currently under control. The patient's cholesterol levels were last checked three years ago.  The patient also reports no issues with constipation or diarrhea. She had a Cologuard test three years ago and has a colonoscopy scheduled for the following week. She reports no symptoms such as bleeding in relation to her bowel movements.       Outpatient Medications Prior to Visit  Medication Sig Dispense Refill   Azelastine HCl 137 MCG/SPRAY SOLN USE 1-2 SPRAYS EACH NOSTRIL ONCE OR TWICE DAILY AS NEEDED 90 mL 1   fexofenadine (ALLEGRA) 180 MG tablet Take 1 tablet (180 mg total) by mouth daily.     norethindrone-ethinyl estradiol-FE (LOESTRIN FE) 1-20 MG-MCG tablet Take 1 tablet by mouth daily.     olopatadine (PATANOL) 0.1 % ophthalmic solution Place 1 drop into both eyes 2 (two) times daily. 5 mL 12   No facility-administered medications prior to visit.    ROS Review of Systems  Objective:  BP 96/76 (BP Location: Left Arm, Patient Position: Sitting, Cuff Size: Normal)   Pulse 74   Temp 97.8 F (36.6 C) (Oral)   Ht 5\' 1"  (1.549 m)   Wt 114 lb 3.2 oz (51.8 kg)   SpO2  94%   BMI 21.58 kg/m   BP Readings from Last 3 Encounters:  01/08/23 96/76  07/04/21 98/64  12/04/19 104/68    Wt Readings from Last 3 Encounters:  01/08/23 114 lb 3.2 oz (51.8 kg)  12/22/22 111 lb (50.3 kg)  07/04/21 110 lb (49.9 kg)    Physical Exam  Lab Results  Component Value Date   WBC 6.6 07/04/2021   HGB 14.9 07/04/2021   HCT 43.3 07/04/2021   PLT 263.0 07/04/2021   GLUCOSE 79 12/04/2019   CHOL 170 12/04/2019   TRIG 77.0 12/04/2019   HDL 74.30 12/04/2019   LDLCALC 80 12/04/2019   ALT 17 12/04/2019   AST 19 12/04/2019   NA 137 12/04/2019   K 4.8 12/04/2019   CL 102 12/04/2019   CREATININE 0.79 12/04/2019   BUN 18 12/04/2019   CO2 29 12/04/2019   TSH 0.91 12/04/2019    NCV with EMG(electromyography)  Result Date: 09/19/2016 Glendale Chard, DO     09/19/2016  9:33 AM [facility] [address] [place], [address]  [place], [place] [address] Tel: [phone] Fax:  [phone] Test Date:  09/19/2016 Patient: Briana Sanchez DOB: 19-Aug-1975 Physician: Nita Sickle, DO Sex: Female Height: 5\' 1"  Ref Phys: Sanda Linger, M.D. ID#: [id number] Temp: 34.0C Technician:  Patient Complaints: This is a 47 year-old female referred for evaluation of neck pain and left arm tingling. NCV & EMG  Findings: Extensive electrodiagnostic testing of the left upper extremity shows: 1. Left median, ulnar, and mixed palmer sensory responses are within normal limits. 2. Left median and ulnar motor responses are within normal limits. 3. There is no evidence of active or chronic motor axon loss changes affecting any of the tested muscles. Motor unit configuration and recruitment pattern is within normal limits. Impression: This is a normal study of the left upper extremity. In particular, there is no evidence of a cervical radiculopathy or carpal tunnel syndrome. ___________________________ Nita Sickle, DO Nerve Conduction Studies Anti Sensory Summary Table  Site NR Peak (ms) Norm Peak (ms) P-T Amp  (V) Norm P-T Amp Left Median Anti Sensory (2nd Digit)  34C Wrist    3.3 <3.4 105.4 >20 Left Ulnar Anti Sensory (5th Digit)  34C Wrist    2.7 <3.1 96.0 >12 Motor Summary Table  Site NR Onset (ms) Norm Onset (ms) O-P Amp (mV) Norm O-P Amp Site1 Site2 Delta-0 (ms) Dist (cm) Vel (m/s) Norm Vel (m/s) Left Median Motor (Abd Poll Brev)  34C Wrist    3.0 <3.9 9.6 >6 Elbow Wrist 4.3 24.5 57 >50 Elbow    7.3  9.4        Left Ulnar Motor (Abd Dig Minimi)  34C Wrist    2.3 <3.1 8.0 >7 B Elbow Wrist 3.1 20.0 65 >50 B Elbow    5.4  7.9  A Elbow B Elbow 1.7 10.0 59 >50 A Elbow    7.1  7.9        Comparison Summary Table  Site NR Peak (ms) Norm Peak (ms) P-T Amp (V) Site1 Site2 Delta-P (ms) Norm Delta (ms) Left Median/Ulnar Palm Comparison (Wrist - 8cm)  34C Median Palm    1.8 <2.2 95.0 Median Palm Ulnar Palm 0.3  Ulnar Palm    1.5 <2.2 30.3     EMG  Side Muscle Ins Act Fibs Psw Fasc Number Recrt Dur Dur. Amp Amp. Poly Poly. Comment Left 1stDorInt Nml Nml Nml Nml Nml Nml Nml Nml Nml Nml Nml Nml N/A Left Ext Indicis Nml Nml Nml Nml Nml Nml Nml Nml Nml Nml Nml Nml N/A Left PronatorTeres Nml Nml Nml Nml Nml Nml Nml Nml Nml Nml Nml Nml N/A Left Biceps Nml Nml Nml Nml Nml Nml Nml Nml Nml Nml Nml Nml N/A Left Triceps Nml Nml Nml Nml Nml Nml Nml Nml Nml Nml Nml Nml N/A Left Cervical Parasp Low Nml Nml Nml Nml NE - - - - - - - N/A Waveforms:        MR Cervical Spine Wo Contrast  Result Date: 09/18/2016 CLINICAL DATA:  Initial evaluation for neck pain with right arm numbness for several months. EXAM: MRI CERVICAL SPINE WITHOUT CONTRAST TECHNIQUE: Multiplanar, multisequence MR imaging of the cervical spine was performed. No intravenous contrast was administered. COMPARISON:  None available. FINDINGS: Alignment: Straightening of the normal cervical lordosis. Trace anterolisthesis of C3 on C4, likely due to facet degeneration. Vertebrae: Vertebral body heights are maintained. No evidence for acute or chronic fracture. Signal  intensity within the vertebral body bone marrow is normal. No discrete or worrisome osseous lesions. No abnormal marrow edema. Cord: Signal intensity within the cervical spinal cord is normal. Posterior Fossa, vertebral arteries, paraspinal tissues: Visualized brain and posterior fossa within normal limits. Craniocervical junction normal. Paraspinous and prevertebral soft tissues within normal limits. Normal intravascular flow voids present within the vertebral arteries bilaterally. Disc levels: C2-C3: Unremarkable. C3-C4: Trace anterolisthesis. Mild disc bulge with right-sided  uncovertebral hypertrophy. Right-sided facet degeneration. Resultant mild right C4 foraminal stenosis. No canal or left foraminal narrowing. C4-C5:  Unremarkable. C5-C6: Mild disc bulge. No significant canal or foraminal stenosis. C6-C7:  Unremarkable. C7-T1:  Unremarkable. Visualized upper thoracic spine within normal limits. IMPRESSION: 1. Mild disc bulge and right-sided uncovertebral hypertrophy with facet degeneration at C3-4, resulting in mild right C4 foraminal stenosis. 2. Mild disc bulge at C5-6 without stenosis. 3. Otherwise unremarkable MRI of the cervical spine. Electronically Signed   By: Rise Mu M.D.   On: 09/18/2016 21:39    Assessment & Plan:  Idiopathic hypotension -     TSH; Future -     Hepatic function panel; Future -     CBC with Differential/Platelet; Future -     Basic metabolic panel; Future  Routine general medical examination at a health care facility -     Lipid panel; Future  Colon cancer screening     Follow-up: Return in about 1 year (around 01/08/2024).  Sanda Linger, MD

## 2023-01-08 NOTE — Patient Instructions (Signed)

## 2023-01-09 ENCOUNTER — Other Ambulatory Visit: Payer: Self-pay | Admitting: Plastic Surgery

## 2023-01-09 DIAGNOSIS — T8543XA Leakage of breast prosthesis and implant, initial encounter: Secondary | ICD-10-CM

## 2023-01-19 ENCOUNTER — Encounter: Payer: Self-pay | Admitting: Gastroenterology

## 2023-01-19 ENCOUNTER — Ambulatory Visit (AMBULATORY_SURGERY_CENTER): Payer: 59 | Admitting: Gastroenterology

## 2023-01-19 VITALS — BP 96/60 | HR 65 | Temp 99.0°F | Resp 15 | Ht 61.0 in | Wt 111.0 lb

## 2023-01-19 DIAGNOSIS — K635 Polyp of colon: Secondary | ICD-10-CM

## 2023-01-19 DIAGNOSIS — Z1211 Encounter for screening for malignant neoplasm of colon: Secondary | ICD-10-CM

## 2023-01-19 DIAGNOSIS — K648 Other hemorrhoids: Secondary | ICD-10-CM | POA: Diagnosis not present

## 2023-01-19 DIAGNOSIS — D122 Benign neoplasm of ascending colon: Secondary | ICD-10-CM

## 2023-01-19 DIAGNOSIS — K6389 Other specified diseases of intestine: Secondary | ICD-10-CM | POA: Diagnosis not present

## 2023-01-19 MED ORDER — SODIUM CHLORIDE 0.9 % IV SOLN: 500.0000 mL | INTRAVENOUS | Status: DC

## 2023-01-19 NOTE — Op Note (Signed)
[facility] Patient Name: Briana Sanchez Procedure Date: 01/19/2023 7:49 AM MRN: [id number] Endoscopist: Briana Sanchez , MD, [id number] Age: 47 Referring MD:  Date of Birth: 10-Jun-1975 Gender: Female Account #: [id number] Procedure:                Colonoscopy Indications:              Screening for colorectal malignant neoplasm, This                            is the patient's first colonoscopy Medicines:                Monitored Anesthesia Care Procedure:                Pre-Anesthesia Assessment:                           - Prior to the procedure, a History and Physical                            was performed, and patient medications and                            allergies were reviewed. The patient's tolerance of                            previous anesthesia was also reviewed. The risks                            and benefits of the procedure and the sedation                            options and risks were discussed with the patient.                            All questions were answered, and informed consent                            was obtained. Prior Anticoagulants: The patient has                            taken no anticoagulant or antiplatelet agents. ASA                            Grade Assessment: I - A normal, healthy patient.                            After reviewing the risks and benefits, the patient                            was deemed in satisfactory condition to undergo the                            procedure.  After obtaining informed consent, the colonoscope                            was passed under direct vision. Throughout the                            procedure, the patient's blood pressure, pulse, and                            oxygen saturations were monitored continuously. The                            Olympus Scope [id number] was introduced through the                            anus and advanced to the the  cecum, identified by                            appendiceal orifice and ileocecal valve. The                            colonoscopy was performed without difficulty. The                            patient tolerated the procedure well. The quality                            of the bowel preparation was excellent. The                            ileocecal valve, appendiceal orifice, and rectum                            were photographed. Scope In: 8:00:27 AM Scope Out: 8:20:42 AM Scope Withdrawal Time: 0 hours 13 minutes 45 seconds  Total Procedure Duration: 0 hours 20 minutes 15 seconds  Findings:                 The perianal and digital rectal examinations were                            normal.                           Repeat examination of right colon under NBI                            performed.                           A diminutive polyp was found in the ascending                            colon. The polyp was semi-sessile. The polyp was  removed with a cold snare. Resection and retrieval                            were complete.                           Internal hemorrhoids were found. The hemorrhoids                            were small.                           The exam was otherwise without abnormality on                            direct and retroflexion views. Complications:            No immediate complications. Estimated Blood Loss:     Estimated blood loss was minimal. Impression:               - One diminutive polyp in the ascending colon,                            removed with a cold snare. Resected and retrieved.                           - Internal hemorrhoids.                           - The examination was otherwise normal on direct                            and retroflexion views. Recommendation:           - Patient has a contact number available for                            emergencies. The signs and symptoms of potential                             delayed complications were discussed with the                            patient. Return to normal activities tomorrow.                            Written discharge instructions were provided to the                            patient.                           - Resume previous diet.                           - Continue present medications.                           -  Await pathology results.                           - Repeat colonoscopy is recommended for                            surveillance. The colonoscopy date will be                            determined after pathology results from today's                            exam become available for review. [name], MD 01/19/2023 8:24:26 AM This report has been signed electronically.

## 2023-01-19 NOTE — Patient Instructions (Signed)
 Educational handout provided to patient related to Hemorrhoids and Polyps  Resume previous diet  Continue present medications  Awaiting pathology results   YOU HAD AN ENDOSCOPIC PROCEDURE TODAY AT THE [facility]:   Refer to the procedure report that was given to you for any specific questions about what was found during the examination.  If the procedure report does not answer your questions, please call your gastroenterologist to clarify.  If you requested that your care partner not be given the details of your procedure findings, then the procedure report has been included in a sealed envelope for you to review at your convenience later.  YOU SHOULD EXPECT: Some feelings of bloating in the abdomen. Passage of more gas than usual.  Walking can help get rid of the air that was put into your GI tract during the procedure and reduce the bloating. If you had a lower endoscopy (such as a colonoscopy or flexible sigmoidoscopy) you may notice spotting of blood in your stool or on the toilet paper. If you underwent a bowel prep for your procedure, you may not have a normal bowel movement for a few days.  Please Note:  You might notice some irritation and congestion in your nose or some drainage.  This is from the oxygen used during your procedure.  There is no need for concern and it should clear up in a day or so.  SYMPTOMS TO REPORT IMMEDIATELY:  Following lower endoscopy (colonoscopy or flexible sigmoidoscopy):  Excessive amounts of blood in the stool  Significant tenderness or worsening of abdominal pains  Swelling of the abdomen that is new, acute  Fever of 100F or higher  For urgent or emergent issues, a gastroenterologist can be reached at any hour by calling [phone]. Do not use MyChart messaging for urgent concerns.    DIET:  We do recommend a small meal at first, but then you may proceed to your regular diet.  Drink plenty of fluids but you should avoid alcoholic  beverages for 24 hours.  ACTIVITY:  You should plan to take it easy for the rest of today and you should NOT DRIVE or use heavy machinery until tomorrow (because of the sedation medicines used during the test).    FOLLOW UP: Our staff will call the number listed on your records the next business day following your procedure.  We will call around 7:15- 8:00 am to check on you and address any questions or concerns that you may have regarding the information given to you following your procedure. If we do not reach you, we will leave a message.     If any biopsies were taken you will be contacted by phone or by letter within the next 1-3 weeks.  Please call us at [phone] if you have not heard about the biopsies in 3 weeks.    SIGNATURES/CONFIDENTIALITY: You and/or your care partner have signed paperwork which will be entered into your electronic medical record.  These signatures attest to the fact that that the information above on your After Visit Summary has been reviewed and is understood.  Full responsibility of the confidentiality of this discharge information lies with you and/or your care-partner.

## 2023-01-19 NOTE — Progress Notes (Signed)
Sedate, gd SR, tolerated procedure well, VSS, report to RN 

## 2023-01-19 NOTE — Progress Notes (Signed)
Called to room to assist during endoscopic procedure.  Patient ID and intended procedure confirmed with present staff. Received instructions for my participation in the procedure from the performing physician.  

## 2023-01-19 NOTE — Progress Notes (Signed)
History and Physical:  This patient presents for endoscopic testing for: Encounter Diagnosis  Name Primary?   Special screening for malignant neoplasms, colon Yes    Average risk for colorectal cancer.  First screening exam.  Patient denies chronic abdominal pain, rectal bleeding, constipation or diarrhea.   Patient is otherwise without complaints or active issues today.   Past Medical History: Past Medical History:  Diagnosis Date   Allergic rhinitis    Tension headache 2010     Past Surgical History: Past Surgical History:  Procedure Laterality Date   BREAST SURGERY  1996   augmentation   CESAREAN SECTION  11/19/2007    Allergies: No Known Allergies  Outpatient Meds: Current Outpatient Medications  Medication Sig Dispense Refill   Azelastine HCl 137 MCG/SPRAY SOLN USE 1-2 SPRAYS EACH NOSTRIL ONCE OR TWICE DAILY AS NEEDED 90 mL 1   fexofenadine (ALLEGRA) 180 MG tablet Take 1 tablet (180 mg total) by mouth daily.     hydrocortisone 2.5 % cream Apply 1 Application topically 2 (two) times daily.     norethindrone-ethinyl estradiol-FE (LOESTRIN FE) 1-20 MG-MCG tablet Take 1 tablet by mouth daily.     Current Facility-Administered Medications  Medication Dose Route Frequency Provider Last Rate Last Admin   0.9 %  sodium chloride infusion  500 mL Intravenous Continuous Danis, Starr Lake III, MD          ___________________________________________________________________ Objective   Exam:  BP 108/69   Pulse 78   Temp 99 F (37.2 C) (Temporal)   Resp 18   Ht 5\' 1"  (1.549 m)   Wt 111 lb (50.3 kg)   SpO2 100%   BMI 20.97 kg/m   CV: regular , S1/S2 Resp: clear to auscultation bilaterally, normal RR and effort noted GI: soft, no tenderness, with active bowel sounds.   Assessment: Encounter Diagnosis  Name Primary?   Special screening for malignant neoplasms, colon Yes     Plan: Colonoscopy   The benefits and risks of the planned procedure were  described in detail with the patient or (when appropriate) their health care proxy.  Risks were outlined as including, but not limited to, bleeding, infection, perforation, adverse medication reaction leading to cardiac or pulmonary decompensation, pancreatitis (if ERCP).  The limitation of incomplete mucosal visualization was also discussed.  No guarantees or warranties were given.  The patient is appropriate for an endoscopic procedure in the ambulatory setting.   - Amada Jupiter, MD

## 2023-01-19 NOTE — Progress Notes (Signed)
Pt's states no medical or surgical changes since previsit or office visit. 

## 2023-01-20 ENCOUNTER — Inpatient Hospital Stay
Admission: RE | Admit: 2023-01-20 | Discharge: 2023-01-20 | Disposition: A | Discharge status: Home / Self Care | Payer: 59 | Source: Ambulatory Visit | Attending: Plastic Surgery

## 2023-01-20 DIAGNOSIS — T8543XA Leakage of breast prosthesis and implant, initial encounter: Secondary | ICD-10-CM

## 2023-01-22 ENCOUNTER — Telehealth: Payer: Self-pay | Admitting: *Deleted

## 2023-01-22 NOTE — Telephone Encounter (Signed)
Attempted f/u phone call. No answer. Left message. °

## 2023-01-23 ENCOUNTER — Encounter: Payer: Self-pay | Admitting: Gastroenterology

## 2023-01-23 ENCOUNTER — Other Ambulatory Visit: Payer: Self-pay | Admitting: Plastic Surgery

## 2023-01-23 DIAGNOSIS — T8543XA Leakage of breast prosthesis and implant, initial encounter: Secondary | ICD-10-CM

## 2023-01-23 LAB — SURGICAL PATHOLOGY

## 2023-02-19 ENCOUNTER — Other Ambulatory Visit: Payer: 59

## 2023-02-20 ENCOUNTER — Ambulatory Visit
Admission: RE | Admit: 2023-02-20 | Discharge: 2023-02-20 | Disposition: A | Discharge status: Home / Self Care | Payer: 59 | Source: Ambulatory Visit | Attending: Plastic Surgery | Admitting: Plastic Surgery

## 2023-02-20 DIAGNOSIS — T8543XA Leakage of breast prosthesis and implant, initial encounter: Secondary | ICD-10-CM

## 2023-08-07 ENCOUNTER — Ambulatory Visit: Payer: Self-pay

## 2023-08-07 NOTE — Telephone Encounter (Signed)
   FYI Only or Action Required?: FYI only for provider.  Patient was last seen in primary care on 01/08/2023 by Joshua Debby CROME, MD. Called Nurse Triage reporting Insect Bite. Symptoms began several days ago. Interventions attempted: Nothing. Symptoms are: gradually worsening.  Triage Disposition: See PCP When Office is Open (Within 3 Days)  Patient/caregiver understands and will follow disposition?: Yes  Copied from CRM [id number]. Topic: Clinical - Red Word Triage >> Aug 07, 2023  2:32 PM [place] E wrote: [facility] that prompted transfer to Nurse Triage: Stung by hornet. Patient stated she is now having 2 nodules pop up under her left ear. Tender to touch, stung 3 days ago. Reason for Disposition  [1] Hives, itching, or swelling elsewhere on body (i.e., not a site of stings) AND [2] started over 2 hours after sting  (Exception: Only at site of sting.)  Answer Assessment - Initial Assessment Questions 1. TYPE: What type of sting was it? (bee, yellow jacket, etc.)      Hornet sting 2. ONSET: When did it occur?      Three days ago, Saturday 3. LOCATION: Where is the sting located?  How many stings?     Left side of head 4. SWELLING SIZE: How big is the swelling? (e.g., inches or cm)     Two nodules behind and under left ear.   Nodule 1- under left ear pea sized, tender to touch, not itchy Nodule 2- left neck, smaller than nodule one, same symptoms, appeared today 5. REDNESS: Is the area red or pink? If Yes, ask: What size is area of redness? (e.g., inches or cm). When did the redness start?     Denies redness 6. PAIN: Is there any pain? If Yes, ask: How bad is it?  (Scale 1-10; or mild, moderate, severe)     Only painful to touch 7. ITCHING: Is there any itching? If Yes, ask: How bad is it?      Denies itching 8. RESPIRATORY DISTRESS: Describe your breathing.     Denies respiratory distress 9. PRIOR REACTIONS: Have you had any severe allergic reactions to  stings in the past? if yes, ask: What happened?     Has had bee stings before, but never with these symptoms 10. OTHER SYMPTOMS: Do you have any other symptoms? (e.g., abdomen pain, face or tongue swelling, new rash elsewhere, vomiting)       Denies any rashes, denies abdominal pain or vomiting  Protocols used: Bee or Yellow Jacket Sting-A-AH

## 2023-08-08 ENCOUNTER — Ambulatory Visit: Admitting: Internal Medicine

## 2023-08-08 ENCOUNTER — Encounter: Payer: Self-pay | Admitting: Internal Medicine

## 2023-08-08 VITALS — BP 100/72 | HR 70 | Temp 98.1°F | Ht 61.0 in | Wt 116.2 lb

## 2023-08-08 DIAGNOSIS — R590 Localized enlarged lymph nodes: Secondary | ICD-10-CM

## 2023-08-08 MED ORDER — CEFDINIR 300 MG PO CAPS: 300.0000 mg | ORAL_CAPSULE | Freq: Two times a day (BID) | ORAL | 0 refills | Status: AC

## 2023-08-08 NOTE — Patient Instructions (Signed)
 Lymphadenopathy  Lymphadenopathy is when your lymph glands are swollen or larger than normal.  Lymph glands, also called lymph nodes, are clumps of tissue. They filter germs and waste from tissues in your body to your bloodstream. They're part of your body's defense system, or immune system. Lymphadenopathy has different causes, like infection, autoimmune disease, and cancer. Lymphadenopathy can happen wherever you have lymph nodes. The type you have depends on which nodes it's in, such as: Cervical lymphadenopathy. This is in the neck. Mediastinal lymphadenopathy. This is in the chest. Hilar lymphadenopathy. This is in the lungs. Axillary lymphadenopathy. This is in the armpits. Inguinal lymphadenopathy. This is in the groin. Sometimes, fluid and cells that fight infection build up in your lymph nodes. This happens when your immune system reacts to germs or other substances that get into your body. This makes lymph nodes swell and get bigger. Treatment is based on what's thought to be the cause. Sometimes, lymph nodes don't go back to normal size after treatment. If yours don't, your health care provider may order tests to help learn why your glands are still swollen and big. Follow these instructions at home:  Take over-the-counter and prescription medicines only as told by your provider. If you were prescribed antibiotics, do not stop using them, even if you start to feel better. If told, apply heat to swollen lymph nodes as told by your provider. Use the heat source that your provider recommends, such as a moist heat pack or a heating pad. Place a towel between your skin and the heat source. Leave the heat on only for the time told by your provider to avoid injury. If your skin turns bright red, remove the heat right away to prevent burns. The risk of burns is higher if you cannot feel pain, heat, or cold. Check your swollen lymph nodes every day for changes. Check other places where you have  lymph nodes as told. Check for changes such as: More swelling. Sudden growth in size. Redness or pain. Hardness. Contact a health care provider if: You have lymph nodes that: Are still swollen after 2 weeks. Have gotten bigger all of a sudden or the swelling spreads. Are red, painful, or hard. Fluid leaks from the skin near a swollen lymph node. You get a fever, chills, or night sweats. You feel tired. You have a sore throat. Your abdomen hurts. You lose weight without trying. This information is not intended to replace advice given to you by your health care provider. Make sure you discuss any questions you have with your health care provider. Document Revised: 04/26/2022 Document Reviewed: 04/26/2022 Elsevier Patient Education  2024 [employer].

## 2023-08-08 NOTE — Progress Notes (Unsigned)
 Subjective:  Patient ID: Briana Sanchez, female    DOB: 1975-05-13  Age: 48 y.o. MRN: [id number]  CC: Mass (2 small mass behind her left ear. The only thing that's happened was she had a hornet sting on Saturday. The masses are tender to the touch )   HPI Briana Sanchez presents for f/up ----  Discussed the use of AI scribe software for clinical note transcription with the patient, who gave verbal consent to proceed.  History of Present Illness   Briana Sanchez is a 48 year old female who presents with nodules behind her left ear following a hornet sting.  She was stung by a hornet on the left side of her head on Saturday, resulting in the development of a nodule behind her left ear and another smaller one nearby. The left side of her head is slightly swollen at the site of the sting. The nodules are not painful unless pressed.  She experienced a mild sore throat on the left side, which has mostly resolved. No fever, chills, trouble swallowing, coughing, or earaches. She denies breathing difficulties.  For the hornet sting, she applied ice, which helped alleviate the initial burning sensation. She is unsure if there is a rash at the site of the sting as she cannot see through her hair.  She is on a medication that stops her menstrual cycles, so she does not currently have them.       Outpatient Medications Prior to Visit  Medication Sig Dispense Refill   Azelastine  HCl 137 MCG/SPRAY SOLN USE 1-2 SPRAYS EACH NOSTRIL ONCE OR TWICE DAILY AS NEEDED 90 mL 1   fexofenadine  (ALLEGRA ) 180 MG tablet Take 1 tablet (180 mg total) by mouth daily.     hydrocortisone  2.5 % cream Apply 1 Application topically 2 (two) times daily.     norethindrone-ethinyl estradiol-FE (LOESTRIN FE) 1-20 MG-MCG tablet Take 1 tablet by mouth daily.     No facility-administered medications prior to visit.    ROS Review of Systems  Constitutional: Negative.  Negative for appetite change, chills, fatigue  and fever.  HENT:  Positive for sore throat. Negative for trouble swallowing.   Eyes: Negative.   Respiratory:  Negative for cough, shortness of breath and wheezing.   Cardiovascular:  Negative for chest pain, palpitations and leg swelling.  Gastrointestinal:  Negative for abdominal pain.  Endocrine: Negative.   Genitourinary: Negative.  Negative for difficulty urinating.  Musculoskeletal: Negative.   Skin: Negative.  Negative for rash.  Neurological: Negative.   Hematological:  Positive for adenopathy. Does not bruise/bleed easily.  Psychiatric/Behavioral: Negative.    All other systems reviewed and are negative.   Objective:  BP 100/72 (BP Location: Left Arm, Patient Position: Sitting, Cuff Size: Small)   Pulse 70   Temp 98.1 F (36.7 C) (Oral)   Ht 5' 1 (1.549 m)   Wt 116 lb 3.2 oz (52.7 kg)   SpO2 98%   BMI 21.96 kg/m   BP Readings from Last 3 Encounters:  08/08/23 100/72  01/19/23 96/60  01/08/23 96/76    Wt Readings from Last 3 Encounters:  08/08/23 116 lb 3.2 oz (52.7 kg)  01/19/23 111 lb (50.3 kg)  01/08/23 114 lb 3.2 oz (51.8 kg)    Physical Exam Vitals reviewed.  Constitutional:      Appearance: Normal appearance.  HENT:     Right Ear: Hearing, tympanic membrane, ear canal and external ear normal.     Left Ear: Hearing, tympanic  membrane, ear canal and external ear normal.     Nose: Nose normal.     Mouth/Throat:     Mouth: Mucous membranes are moist.   Eyes:     General: No scleral icterus.    Conjunctiva/sclera: Conjunctivae normal.    Cardiovascular:     Rate and Rhythm: Normal rate and regular rhythm.     Heart sounds: No murmur heard.    No friction rub. No gallop.  Pulmonary:     Effort: Pulmonary effort is normal.     Breath sounds: No stridor. No wheezing, rhonchi or rales.  Abdominal:     General: Abdomen is flat.     Palpations: There is no mass.     Tenderness: There is no abdominal tenderness. There is no guarding.     Hernia:  No hernia is present.  Lymphadenopathy:     Head:     Right side of head: No submental, submandibular, tonsillar, preauricular, posterior auricular or occipital adenopathy.     Left side of head: Posterior auricular adenopathy present. No submental, submandibular, tonsillar, preauricular or occipital adenopathy.     Cervical: No cervical adenopathy.     Right cervical: No superficial, deep or posterior cervical adenopathy.    Left cervical: No superficial, deep or posterior cervical adenopathy.     Upper Body:     Right upper body: No supraclavicular, axillary, pectoral or epitrochlear adenopathy.     Left upper body: No supraclavicular, axillary, pectoral or epitrochlear adenopathy.     Lower Body: No right inguinal adenopathy. No left inguinal adenopathy.   Skin:    Findings: No erythema or rash.   Neurological:     General: No focal deficit present.     Mental Status: She is alert.     Lab Results  Component Value Date   WBC 7.1 01/08/2023   HGB 15.0 01/08/2023   HCT 45.2 01/08/2023   PLT 299.0 01/08/2023   GLUCOSE 85 01/08/2023   CHOL 201 (H) 01/08/2023   TRIG 69.0 01/08/2023   HDL 89.30 01/08/2023   LDLCALC 98 01/08/2023   ALT 14 01/08/2023   AST 18 01/08/2023   NA 136 01/08/2023   K 4.7 01/08/2023   CL 102 01/08/2023   CREATININE 0.86 01/08/2023   BUN 22 01/08/2023   CO2 26 01/08/2023   TSH 0.40 01/08/2023    MM 3D DIAGNOSTIC MAMMOGRAM BILATERAL BREAST W/IMPLANT Result Date: 02/20/2023 CLINICAL DATA:  48 year old with indwelling retropectoral silicone implants, shown on recent MRI 01/20/2023 to have possible RIGHT axillary lymph node enlargement. Intracapsular RIGHT implant rupture was noted on the MRI. She is due for her annual mammographic evaluation. EXAM: DIGITAL DIAGNOSTIC BILATERAL MAMMOGRAM WITH IMPLANTS, TOMOSYNTHESIS AND CAD; US  AXILLARY RIGHT TECHNIQUE: Bilateral digital diagnostic mammography and breast tomosynthesis was performed. Standard and/or implant  displaced views were performed. The images were evaluated with computer-aided detection. ; Targeted ultrasound examination of the right axilla was performed. COMPARISON:  Previous exam(s). ACR Breast Density Category c: The breasts are heterogeneously dense, which may obscure small masses. FINDINGS: The patient has retropectoral silicone implants. Full field CC and MLO views of both breasts and implant displaced CC and MLO views of both breasts with tomosynthesis were obtained. RIGHT: No findings suspicious for malignancy. Intracapsular implant rupture as was demonstrated on the recent MRI. Sonographic evaluation of the RIGHT axilla demonstrates the snowstorm appearance of silicone within at least 3 adjacent lymph nodes. The lymph nodes are normal in morphology with normal cortical thickness. No  pathologically enlarged lymph nodes are identified. LEFT: No findings suspicious for malignancy. IMPRESSION: 1. No mammographic evidence of malignancy involving either breast. 2. No pathologically enlarged RIGHT axillary lymph nodes. There are 3 adjacent lymph nodes with normal morphology that have silicone within them. RECOMMENDATION: Screening mammogram in one year.(Code:SM-B-01Y) I have discussed the findings and recommendations with the patient. If applicable, a reminder letter will be sent to the patient regarding the next appointment. BI-RADS CATEGORY  2: Benign. Electronically Signed   By: Debby Satterfield M.D.   On: 02/20/2023 15:09   US  AXILLA RIGHT Result Date: 02/20/2023 CLINICAL DATA:  48 year old with indwelling retropectoral silicone implants, shown on recent MRI 01/20/2023 to have possible RIGHT axillary lymph node enlargement. Intracapsular RIGHT implant rupture was noted on the MRI. She is due for her annual mammographic evaluation. EXAM: DIGITAL DIAGNOSTIC BILATERAL MAMMOGRAM WITH IMPLANTS, TOMOSYNTHESIS AND CAD; US  AXILLARY RIGHT TECHNIQUE: Bilateral digital diagnostic mammography and breast  tomosynthesis was performed. Standard and/or implant displaced views were performed. The images were evaluated with computer-aided detection. ; Targeted ultrasound examination of the right axilla was performed. COMPARISON:  Previous exam(s). ACR Breast Density Category c: The breasts are heterogeneously dense, which may obscure small masses. FINDINGS: The patient has retropectoral silicone implants. Full field CC and MLO views of both breasts and implant displaced CC and MLO views of both breasts with tomosynthesis were obtained. RIGHT: No findings suspicious for malignancy. Intracapsular implant rupture as was demonstrated on the recent MRI. Sonographic evaluation of the RIGHT axilla demonstrates the snowstorm appearance of silicone within at least 3 adjacent lymph nodes. The lymph nodes are normal in morphology with normal cortical thickness. No pathologically enlarged lymph nodes are identified. LEFT: No findings suspicious for malignancy. IMPRESSION: 1. No mammographic evidence of malignancy involving either breast. 2. No pathologically enlarged RIGHT axillary lymph nodes. There are 3 adjacent lymph nodes with normal morphology that have silicone within them. RECOMMENDATION: Screening mammogram in one year.(Code:SM-B-01Y) I have discussed the findings and recommendations with the patient. If applicable, a reminder letter will be sent to the patient regarding the next appointment. BI-RADS CATEGORY  2: Benign. Electronically Signed   By: Debby Satterfield M.D.   On: 02/20/2023 15:09    Assessment & Plan:  LAD (lymphadenopathy), periauricular -     Cefdinir; Take 1 capsule (300 mg total) by mouth 2 (two) times daily for 7 days.  Dispense: 14 capsule; Refill: 0     Follow-up: Return if symptoms worsen or fail to improve.  Debby Molt, MD

## 2024-01-28 ENCOUNTER — Ambulatory Visit: Payer: Self-pay

## 2024-01-28 NOTE — Telephone Encounter (Signed)
 FYI Only or Action Required?: Action required by provider: update on patient condition.  Patient was last seen in primary care on 08/08/2023 by Joshua Debby CROME, MD.  Called Nurse Triage reporting Influenza.  Symptoms began yesterday.  Interventions attempted: Nothing.  Symptoms are: unchanged.Daughter tested positive for flu. Pt. Has flu symptoms, fever,cough, chills. Declines OV. Reviewed home care. Will call back as needed.  Triage Disposition: Home Care  Patient/caregiver understands and will follow disposition?: Yes       Reason for Disposition  Influenza, questions about  Answer Assessment - Initial Assessment Questions 1. TYPE of EXPOSURE: How were you exposed? (e.g., close contact, not a close contact)     daughter 2. DATE of EXPOSURE: When did the exposure occur? (e.g., hour, days, weeks)     yesterday 3. SYMPTOMS: Do you have any symptoms? (e.g., cough, fever, sore throat, difficulty breathing).     yesterday 4. HIGH RISK for COMPLICATIONS: Do you have any heart or lung problems? Do you have a weakened immune system? (e.g., CHF, COPD, asthma, HIV positive, chemotherapy, renal failure, diabetes mellitus, sickle cell anemia)     no 5. PREGNANCY: Is there any chance you are pregnant? When was your last menstrual period?     no  Protocols used: Influenza (Flu) Exposure-A-AH
